# Patient Record
Sex: Female | Born: 1955 | Race: White | Hispanic: No | Marital: Married | Smoking: Never smoker
Health system: Southern US, Community
[De-identification: ages and names within clinical notes are randomized; demographics above are authoritative.]

## PROBLEM LIST (undated history)

## (undated) DIAGNOSIS — I10 Essential (primary) hypertension: Secondary | ICD-10-CM

## (undated) DIAGNOSIS — G35D Multiple sclerosis, unspecified: Secondary | ICD-10-CM

## (undated) DIAGNOSIS — M21612 Bunion of left foot: Secondary | ICD-10-CM

## (undated) DIAGNOSIS — N3281 Overactive bladder: Secondary | ICD-10-CM

## (undated) DIAGNOSIS — M1990 Unspecified osteoarthritis, unspecified site: Secondary | ICD-10-CM

## (undated) DIAGNOSIS — M21611 Bunion of right foot: Secondary | ICD-10-CM

## (undated) DIAGNOSIS — G35 Multiple sclerosis: Secondary | ICD-10-CM

## (undated) HISTORY — PX: KNEE ARTHROSCOPY: SUR90

## (undated) HISTORY — DX: Overactive bladder: N32.81

---

## 1987-05-26 HISTORY — PX: CHOLECYSTECTOMY: SHX55

## 1998-01-09 ENCOUNTER — Encounter: Admission: RE | Admit: 1998-01-09 | Discharge: 1998-04-09 | Payer: Self-pay | Admitting: Orthopedic Surgery

## 1998-02-10 ENCOUNTER — Emergency Department (HOSPITAL_COMMUNITY): Admission: EM | Admit: 1998-02-10 | Discharge: 1998-02-10 | Payer: Self-pay | Admitting: Emergency Medicine

## 1998-02-12 ENCOUNTER — Ambulatory Visit (HOSPITAL_COMMUNITY): Admission: RE | Admit: 1998-02-12 | Discharge: 1998-02-12 | Payer: Self-pay | Admitting: Pediatrics

## 1998-06-05 ENCOUNTER — Ambulatory Visit (HOSPITAL_COMMUNITY): Admission: RE | Admit: 1998-06-05 | Discharge: 1998-06-05 | Payer: Self-pay | Admitting: Gastroenterology

## 1998-12-05 ENCOUNTER — Ambulatory Visit (HOSPITAL_COMMUNITY): Admission: RE | Admit: 1998-12-05 | Discharge: 1998-12-05 | Payer: Self-pay | Admitting: Obstetrics and Gynecology

## 2000-09-20 ENCOUNTER — Encounter: Admission: RE | Admit: 2000-09-20 | Discharge: 2000-09-20 | Payer: Self-pay | Admitting: Obstetrics and Gynecology

## 2000-09-20 ENCOUNTER — Encounter: Payer: Self-pay | Admitting: Obstetrics and Gynecology

## 2002-05-02 ENCOUNTER — Ambulatory Visit (HOSPITAL_COMMUNITY): Admission: RE | Admit: 2002-05-02 | Discharge: 2002-05-02 | Payer: Self-pay | Admitting: Orthopedic Surgery

## 2002-05-07 ENCOUNTER — Encounter: Payer: Self-pay | Admitting: Orthopedic Surgery

## 2002-05-07 ENCOUNTER — Ambulatory Visit (HOSPITAL_COMMUNITY): Admission: RE | Admit: 2002-05-07 | Discharge: 2002-05-07 | Payer: Self-pay | Admitting: Orthopedic Surgery

## 2002-07-26 ENCOUNTER — Encounter (HOSPITAL_COMMUNITY): Admission: RE | Admit: 2002-07-26 | Discharge: 2002-10-24 | Payer: Self-pay | Admitting: Neurology

## 2003-11-06 ENCOUNTER — Encounter (HOSPITAL_COMMUNITY): Admission: RE | Admit: 2003-11-06 | Discharge: 2003-11-19 | Payer: Self-pay | Admitting: Neurology

## 2004-12-13 ENCOUNTER — Ambulatory Visit (HOSPITAL_COMMUNITY): Admission: RE | Admit: 2004-12-13 | Discharge: 2004-12-13 | Payer: Self-pay | Admitting: Orthopedic Surgery

## 2005-01-14 ENCOUNTER — Ambulatory Visit (HOSPITAL_COMMUNITY): Admission: RE | Admit: 2005-01-14 | Discharge: 2005-01-14 | Payer: Self-pay | Admitting: Orthopedic Surgery

## 2005-01-14 ENCOUNTER — Ambulatory Visit (HOSPITAL_BASED_OUTPATIENT_CLINIC_OR_DEPARTMENT_OTHER): Admission: RE | Admit: 2005-01-14 | Discharge: 2005-01-14 | Payer: Self-pay | Admitting: Orthopedic Surgery

## 2005-01-28 ENCOUNTER — Encounter: Admission: RE | Admit: 2005-01-28 | Discharge: 2005-02-18 | Payer: Self-pay | Admitting: Orthopedic Surgery

## 2005-05-12 ENCOUNTER — Encounter: Admission: RE | Admit: 2005-05-12 | Discharge: 2005-05-12 | Payer: Self-pay | Admitting: Neurology

## 2005-08-21 ENCOUNTER — Ambulatory Visit (HOSPITAL_COMMUNITY): Admission: RE | Admit: 2005-08-21 | Discharge: 2005-08-21 | Payer: Self-pay | Admitting: Neurology

## 2005-09-23 ENCOUNTER — Encounter: Payer: Self-pay | Admitting: Pediatrics

## 2006-09-09 ENCOUNTER — Ambulatory Visit (HOSPITAL_COMMUNITY): Admission: RE | Admit: 2006-09-09 | Discharge: 2006-09-09 | Payer: Self-pay | Admitting: Neurology

## 2006-10-08 ENCOUNTER — Encounter (HOSPITAL_COMMUNITY): Admission: RE | Admit: 2006-10-08 | Discharge: 2007-01-06 | Payer: Self-pay | Admitting: Neurology

## 2006-11-04 ENCOUNTER — Encounter: Admission: RE | Admit: 2006-11-04 | Discharge: 2006-11-04 | Payer: Self-pay | Admitting: Obstetrics and Gynecology

## 2007-01-25 ENCOUNTER — Encounter (HOSPITAL_COMMUNITY): Admission: RE | Admit: 2007-01-25 | Discharge: 2007-04-25 | Payer: Self-pay | Admitting: Neurology

## 2007-05-04 ENCOUNTER — Encounter (HOSPITAL_COMMUNITY): Admission: RE | Admit: 2007-05-04 | Discharge: 2007-06-06 | Payer: Self-pay | Admitting: Neurology

## 2007-06-08 ENCOUNTER — Encounter (HOSPITAL_COMMUNITY): Admission: RE | Admit: 2007-06-08 | Discharge: 2007-09-06 | Payer: Self-pay | Admitting: Neurology

## 2007-10-05 ENCOUNTER — Encounter (HOSPITAL_COMMUNITY): Admission: RE | Admit: 2007-10-05 | Discharge: 2008-01-02 | Payer: Self-pay | Admitting: Neurology

## 2007-12-02 ENCOUNTER — Encounter (HOSPITAL_COMMUNITY): Admission: RE | Admit: 2007-12-02 | Discharge: 2008-02-01 | Payer: Self-pay | Admitting: Neurology

## 2008-02-28 ENCOUNTER — Encounter (HOSPITAL_COMMUNITY): Admission: RE | Admit: 2008-02-28 | Discharge: 2008-05-28 | Payer: Self-pay | Admitting: Neurology

## 2008-05-29 ENCOUNTER — Encounter (HOSPITAL_COMMUNITY): Admission: RE | Admit: 2008-05-29 | Discharge: 2008-08-27 | Payer: Self-pay | Admitting: Neurology

## 2008-08-15 ENCOUNTER — Encounter: Admission: RE | Admit: 2008-08-15 | Discharge: 2008-11-13 | Payer: Self-pay | Admitting: Neurology

## 2008-09-17 ENCOUNTER — Encounter (HOSPITAL_COMMUNITY): Admission: RE | Admit: 2008-09-17 | Discharge: 2008-12-16 | Payer: Self-pay | Admitting: Neurology

## 2008-10-31 ENCOUNTER — Encounter: Admission: RE | Admit: 2008-10-31 | Discharge: 2008-10-31 | Payer: Self-pay | Admitting: Neurology

## 2008-12-24 ENCOUNTER — Encounter (HOSPITAL_COMMUNITY): Admission: RE | Admit: 2008-12-24 | Discharge: 2009-03-24 | Payer: Self-pay | Admitting: Neurology

## 2009-04-15 ENCOUNTER — Encounter (HOSPITAL_COMMUNITY): Admission: RE | Admit: 2009-04-15 | Discharge: 2009-05-24 | Payer: Self-pay | Admitting: Neurology

## 2009-05-13 ENCOUNTER — Encounter: Admission: RE | Admit: 2009-05-13 | Discharge: 2009-05-13 | Payer: Self-pay | Admitting: Neurology

## 2009-06-10 ENCOUNTER — Encounter (HOSPITAL_COMMUNITY): Admission: RE | Admit: 2009-06-10 | Discharge: 2009-09-08 | Payer: Self-pay | Admitting: Neurology

## 2009-09-30 ENCOUNTER — Encounter (HOSPITAL_COMMUNITY): Admission: RE | Admit: 2009-09-30 | Discharge: 2009-12-29 | Payer: Self-pay | Admitting: Neurology

## 2009-11-26 ENCOUNTER — Encounter: Admission: RE | Admit: 2009-11-26 | Discharge: 2009-11-26 | Payer: Self-pay | Admitting: Neurology

## 2010-01-01 ENCOUNTER — Encounter (HOSPITAL_COMMUNITY): Admission: RE | Admit: 2010-01-01 | Discharge: 2010-02-25 | Payer: Self-pay | Admitting: Neurology

## 2010-02-25 ENCOUNTER — Encounter (HOSPITAL_COMMUNITY)
Admission: RE | Admit: 2010-02-25 | Discharge: 2010-05-26 | Payer: Self-pay | Source: Home / Self Care | Attending: Diagnostic Neuroimaging | Admitting: Diagnostic Neuroimaging

## 2010-05-21 ENCOUNTER — Encounter
Admission: RE | Admit: 2010-05-21 | Discharge: 2010-05-21 | Payer: Self-pay | Source: Home / Self Care | Attending: Diagnostic Neuroimaging | Admitting: Diagnostic Neuroimaging

## 2010-05-28 ENCOUNTER — Encounter (HOSPITAL_COMMUNITY)
Admission: RE | Admit: 2010-05-28 | Discharge: 2010-06-24 | Payer: Self-pay | Source: Home / Self Care | Attending: Diagnostic Neuroimaging | Admitting: Diagnostic Neuroimaging

## 2010-06-26 ENCOUNTER — Ambulatory Visit (HOSPITAL_COMMUNITY): Payer: Managed Care, Other (non HMO) | Attending: Diagnostic Neuroimaging

## 2010-06-26 DIAGNOSIS — G35 Multiple sclerosis: Secondary | ICD-10-CM | POA: Insufficient documentation

## 2010-06-27 ENCOUNTER — Encounter (HOSPITAL_COMMUNITY): Payer: Self-pay

## 2010-07-25 ENCOUNTER — Encounter (HOSPITAL_COMMUNITY): Payer: Managed Care, Other (non HMO) | Attending: Diagnostic Neuroimaging

## 2010-07-25 DIAGNOSIS — G35 Multiple sclerosis: Secondary | ICD-10-CM | POA: Insufficient documentation

## 2010-08-22 ENCOUNTER — Encounter (HOSPITAL_COMMUNITY)
Admission: RE | Admit: 2010-08-22 | Discharge: 2010-08-22 | Disposition: A | Discharge status: Home / Self Care | Payer: Managed Care, Other (non HMO) | Source: Ambulatory Visit | Attending: Diagnostic Neuroimaging | Admitting: Diagnostic Neuroimaging

## 2010-08-22 DIAGNOSIS — G35 Multiple sclerosis: Secondary | ICD-10-CM | POA: Insufficient documentation

## 2010-09-19 ENCOUNTER — Encounter (HOSPITAL_COMMUNITY)
Admission: RE | Admit: 2010-09-19 | Discharge: 2010-09-19 | Disposition: A | Discharge status: Home / Self Care | Payer: Managed Care, Other (non HMO) | Source: Ambulatory Visit | Attending: Diagnostic Neuroimaging | Admitting: Diagnostic Neuroimaging

## 2010-09-19 DIAGNOSIS — G35 Multiple sclerosis: Secondary | ICD-10-CM | POA: Insufficient documentation

## 2010-10-10 NOTE — Op Note (Signed)
NAME:  Tiffany Barnes, Tiffany Barnes NO.:  [id number]   MEDICAL RECORD NO.:  [id number]          PATIENT TYPE:  AMB   LOCATION:  DSC                          FACILITY:  [facility]   PHYSICIAN:  Dyke Brackett, M.D.    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   POSTOPERATIVE DIAGNOSIS:  Torn lateral meniscus with osteoarthritis  patellofemoral joint and medial compartment.   OPERATION:  1.  Partial lateral meniscectomy.  2.  Debridement chondroplasty of patellofemoral joint, lateral compartment.   SURGEON:  Dr. Madelon Lips   ANESTHESIA:  General with a block.   DESCRIPTION OF PROCEDURE:  She was arthroscoped through an inferomedial and  inferolateral portal.  Systematic inspection of the knee showed the patient  to have grade 3 changes, generalized over the entire weightbearing surface  of the lateral compartment.  These were accompanied by a degenerative tear  of the meniscus requiring resection of the probably the anterior two-thirds  of the meniscus, leaving the meniscus substance posteriorly around.  Aggressive debridement was carried out.  Good resection line was obtained.  The medial compartment was essentially normal.  Patellofemoral joint showed  moderate changes of an osteoarthritic nature, and these were aggressively  debrided.  Knee drained free of fluid, portals closed with nylon, and  infiltrated with Marcaine and morphine and given 80 mg Depo-Medrol.      Dyke Brackett, M.D.  Electronically Signed     WDC/MEDQ  D:  01/14/2005  T:  01/14/2005  Job:  [id number]

## 2010-10-22 ENCOUNTER — Encounter (HOSPITAL_COMMUNITY)
Admission: RE | Admit: 2010-10-22 | Discharge: 2010-10-22 | Disposition: A | Discharge status: Home / Self Care | Payer: Managed Care, Other (non HMO) | Source: Ambulatory Visit | Attending: Diagnostic Neuroimaging | Admitting: Diagnostic Neuroimaging

## 2010-10-22 DIAGNOSIS — G35 Multiple sclerosis: Secondary | ICD-10-CM | POA: Insufficient documentation

## 2010-11-03 ENCOUNTER — Other Ambulatory Visit: Payer: Self-pay | Admitting: Diagnostic Neuroimaging

## 2010-11-03 DIAGNOSIS — G35 Multiple sclerosis: Secondary | ICD-10-CM

## 2010-11-12 ENCOUNTER — Ambulatory Visit
Admission: RE | Admit: 2010-11-12 | Discharge: 2010-11-12 | Disposition: A | Discharge status: Home / Self Care | Payer: Managed Care, Other (non HMO) | Source: Ambulatory Visit | Attending: Diagnostic Neuroimaging | Admitting: Diagnostic Neuroimaging

## 2010-11-12 DIAGNOSIS — G35 Multiple sclerosis: Secondary | ICD-10-CM

## 2010-11-19 ENCOUNTER — Ambulatory Visit (HOSPITAL_COMMUNITY): Payer: Managed Care, Other (non HMO) | Attending: Diagnostic Neuroimaging

## 2010-11-19 DIAGNOSIS — G35 Multiple sclerosis: Secondary | ICD-10-CM | POA: Insufficient documentation

## 2010-12-17 ENCOUNTER — Encounter (HOSPITAL_COMMUNITY): Payer: Managed Care, Other (non HMO)

## 2010-12-22 ENCOUNTER — Encounter (HOSPITAL_COMMUNITY): Payer: Managed Care, Other (non HMO)

## 2010-12-25 ENCOUNTER — Encounter (HOSPITAL_COMMUNITY): Payer: Managed Care, Other (non HMO) | Attending: Diagnostic Neuroimaging

## 2010-12-25 DIAGNOSIS — G35 Multiple sclerosis: Secondary | ICD-10-CM | POA: Insufficient documentation

## 2011-01-22 ENCOUNTER — Encounter (HOSPITAL_COMMUNITY): Payer: Managed Care, Other (non HMO)

## 2011-04-03 ENCOUNTER — Other Ambulatory Visit: Payer: Self-pay | Admitting: Diagnostic Neuroimaging

## 2011-04-03 DIAGNOSIS — G35 Multiple sclerosis: Secondary | ICD-10-CM

## 2011-04-10 ENCOUNTER — Ambulatory Visit
Admission: RE | Admit: 2011-04-10 | Discharge: 2011-04-10 | Disposition: A | Discharge status: Home / Self Care | Payer: Managed Care, Other (non HMO) | Source: Ambulatory Visit | Attending: Diagnostic Neuroimaging | Admitting: Diagnostic Neuroimaging

## 2011-04-10 DIAGNOSIS — G35 Multiple sclerosis: Secondary | ICD-10-CM

## 2011-04-10 MED ORDER — GADOBENATE DIMEGLUMINE 529 MG/ML IV SOLN
19.0000 mL | Freq: Once | INTRAVENOUS | Status: AC | PRN
  Administered 2011-04-10: 19 mL via INTRAVENOUS

## 2011-08-12 ENCOUNTER — Other Ambulatory Visit: Payer: Self-pay | Admitting: Family Medicine

## 2011-08-12 DIAGNOSIS — Z1231 Encounter for screening mammogram for malignant neoplasm of breast: Secondary | ICD-10-CM

## 2011-08-24 ENCOUNTER — Ambulatory Visit
Admission: RE | Admit: 2011-08-24 | Discharge: 2011-08-24 | Disposition: A | Discharge status: Home / Self Care | Payer: Managed Care, Other (non HMO) | Source: Ambulatory Visit | Attending: Family Medicine | Admitting: Family Medicine

## 2011-08-24 DIAGNOSIS — Z1231 Encounter for screening mammogram for malignant neoplasm of breast: Secondary | ICD-10-CM

## 2012-08-01 ENCOUNTER — Encounter (HOSPITAL_BASED_OUTPATIENT_CLINIC_OR_DEPARTMENT_OTHER): Payer: Managed Care, Other (non HMO) | Attending: Plastic Surgery

## 2012-08-01 DIAGNOSIS — S20229A Contusion of unspecified back wall of thorax, initial encounter: Secondary | ICD-10-CM | POA: Insufficient documentation

## 2012-08-01 DIAGNOSIS — Y92410 Unspecified street and highway as the place of occurrence of the external cause: Secondary | ICD-10-CM | POA: Insufficient documentation

## 2012-08-01 DIAGNOSIS — Z79899 Other long term (current) drug therapy: Secondary | ICD-10-CM | POA: Insufficient documentation

## 2012-08-01 DIAGNOSIS — S301XXA Contusion of abdominal wall, initial encounter: Secondary | ICD-10-CM | POA: Insufficient documentation

## 2012-08-01 DIAGNOSIS — I10 Essential (primary) hypertension: Secondary | ICD-10-CM | POA: Insufficient documentation

## 2012-08-01 DIAGNOSIS — S9030XA Contusion of unspecified foot, initial encounter: Secondary | ICD-10-CM | POA: Insufficient documentation

## 2012-08-01 DIAGNOSIS — G35 Multiple sclerosis: Secondary | ICD-10-CM | POA: Insufficient documentation

## 2012-08-01 DIAGNOSIS — L988 Other specified disorders of the skin and subcutaneous tissue: Secondary | ICD-10-CM | POA: Insufficient documentation

## 2012-08-01 DIAGNOSIS — S8010XA Contusion of unspecified lower leg, initial encounter: Secondary | ICD-10-CM | POA: Insufficient documentation

## 2012-08-01 DIAGNOSIS — S20219A Contusion of unspecified front wall of thorax, initial encounter: Secondary | ICD-10-CM | POA: Insufficient documentation
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
## 2012-08-10 ENCOUNTER — Other Ambulatory Visit: Payer: Self-pay | Admitting: Diagnostic Neuroimaging

## 2012-08-11 LAB — CBC WITH DIFFERENTIAL/PLATELET
Basos: 0 % (ref 0–3)
Eosinophils Absolute: 0.1 10*3/uL (ref 0.0–0.4)
Immature Grans (Abs): 0 10*3/uL (ref 0.0–0.1)
Immature Granulocytes: 0 % (ref 0–2)
MCH: 28.1 pg (ref 26.6–33.0)
MCHC: 32.2 g/dL (ref 31.5–35.7)
MCV: 87 fL (ref 79–97)
Monocytes: 9 % (ref 4–12)
Neutrophils Absolute: 5.8 10*3/uL (ref 1.4–7.0)
Neutrophils Relative %: 82 % — ABNORMAL HIGH (ref 40–74)
RDW: 13.7 % (ref 12.3–15.4)
WBC: 7 10*3/uL (ref 3.4–10.8)

## 2012-08-18 ENCOUNTER — Telehealth: Payer: Self-pay | Admitting: *Deleted

## 2012-08-18 NOTE — Telephone Encounter (Signed)
Patient calling wanting to know her lab results.   Did not see results in [facility].

## 2012-08-18 NOTE — Telephone Encounter (Signed)
Labs were better. They were put in the new system under Dr. Richrd Humbles name. Thanks for calling.

## 2012-08-19 NOTE — Telephone Encounter (Signed)
Spoke to the patient and she is aware of her labs results.

## 2012-08-24 ENCOUNTER — Encounter (HOSPITAL_BASED_OUTPATIENT_CLINIC_OR_DEPARTMENT_OTHER): Payer: Managed Care, Other (non HMO) | Attending: General Surgery

## 2012-08-24 DIAGNOSIS — T8189XA Other complications of procedures, not elsewhere classified, initial encounter: Secondary | ICD-10-CM | POA: Insufficient documentation

## 2012-08-24 DIAGNOSIS — Y839 Surgical procedure, unspecified as the cause of abnormal reaction of the patient, or of later complication, without mention of misadventure at the time of the procedure: Secondary | ICD-10-CM | POA: Insufficient documentation

## 2012-09-23 ENCOUNTER — Encounter (HOSPITAL_BASED_OUTPATIENT_CLINIC_OR_DEPARTMENT_OTHER): Payer: Managed Care, Other (non HMO) | Attending: General Surgery

## 2012-09-23 DIAGNOSIS — Y839 Surgical procedure, unspecified as the cause of abnormal reaction of the patient, or of later complication, without mention of misadventure at the time of the procedure: Secondary | ICD-10-CM | POA: Insufficient documentation

## 2012-09-23 DIAGNOSIS — T8189XA Other complications of procedures, not elsewhere classified, initial encounter: Secondary | ICD-10-CM | POA: Insufficient documentation

## 2012-09-23 DIAGNOSIS — I87309 Chronic venous hypertension (idiopathic) without complications of unspecified lower extremity: Secondary | ICD-10-CM | POA: Insufficient documentation

## 2012-10-24 ENCOUNTER — Ambulatory Visit: Payer: Self-pay | Admitting: Diagnostic Neuroimaging

## 2012-10-26 ENCOUNTER — Encounter (HOSPITAL_BASED_OUTPATIENT_CLINIC_OR_DEPARTMENT_OTHER): Payer: Managed Care, Other (non HMO) | Attending: General Surgery

## 2012-10-26 DIAGNOSIS — G35 Multiple sclerosis: Secondary | ICD-10-CM | POA: Insufficient documentation

## 2012-10-26 DIAGNOSIS — L97809 Non-pressure chronic ulcer of other part of unspecified lower leg with unspecified severity: Secondary | ICD-10-CM | POA: Insufficient documentation

## 2012-10-26 DIAGNOSIS — I10 Essential (primary) hypertension: Secondary | ICD-10-CM | POA: Insufficient documentation

## 2012-10-27 ENCOUNTER — Encounter (HOSPITAL_BASED_OUTPATIENT_CLINIC_OR_DEPARTMENT_OTHER): Payer: Self-pay | Admitting: *Deleted

## 2012-10-27 NOTE — Progress Notes (Addendum)
To [facility] at 1415-Istat,Ekg on arrival-Npo solid food after Mn-clear liquids only til 0630,then Npo.instructed to bring wound vac and any supplies from home.  DR SANGER HAD CANCELLATION SO THIS PT IS MOVED TO 1345. CALLED AND SPOKE TO PT TO ARRIVE AT 1230 AND CLEAR LIQUIDS UNTIL 0630
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
## 2012-10-31 ENCOUNTER — Encounter (HOSPITAL_BASED_OUTPATIENT_CLINIC_OR_DEPARTMENT_OTHER): Payer: Self-pay

## 2012-10-31 ENCOUNTER — Other Ambulatory Visit: Payer: Self-pay

## 2012-10-31 ENCOUNTER — Ambulatory Visit (HOSPITAL_BASED_OUTPATIENT_CLINIC_OR_DEPARTMENT_OTHER): Payer: Managed Care, Other (non HMO) | Admitting: Anesthesiology

## 2012-10-31 ENCOUNTER — Other Ambulatory Visit: Payer: Self-pay | Admitting: Plastic Surgery

## 2012-10-31 ENCOUNTER — Encounter (HOSPITAL_BASED_OUTPATIENT_CLINIC_OR_DEPARTMENT_OTHER): Payer: Self-pay | Admitting: Anesthesiology

## 2012-10-31 ENCOUNTER — Ambulatory Visit (HOSPITAL_BASED_OUTPATIENT_CLINIC_OR_DEPARTMENT_OTHER)
Admission: RE | Admit: 2012-10-31 | Discharge: 2012-10-31 | Disposition: A | Discharge status: Home / Self Care | Payer: Managed Care, Other (non HMO) | Source: Ambulatory Visit | Attending: Plastic Surgery | Admitting: Plastic Surgery

## 2012-10-31 ENCOUNTER — Encounter (HOSPITAL_BASED_OUTPATIENT_CLINIC_OR_DEPARTMENT_OTHER)
Admission: RE | Disposition: A | Discharge status: Home / Self Care | Payer: Self-pay | Source: Ambulatory Visit | Attending: Plastic Surgery

## 2012-10-31 DIAGNOSIS — S81809A Unspecified open wound, unspecified lower leg, initial encounter: Secondary | ICD-10-CM

## 2012-10-31 DIAGNOSIS — I10 Essential (primary) hypertension: Secondary | ICD-10-CM | POA: Insufficient documentation

## 2012-10-31 DIAGNOSIS — M1710 Unilateral primary osteoarthritis, unspecified knee: Secondary | ICD-10-CM | POA: Insufficient documentation

## 2012-10-31 DIAGNOSIS — L97909 Non-pressure chronic ulcer of unspecified part of unspecified lower leg with unspecified severity: Secondary | ICD-10-CM | POA: Insufficient documentation

## 2012-10-31 DIAGNOSIS — L97902 Non-pressure chronic ulcer of unspecified part of unspecified lower leg with fat layer exposed: Secondary | ICD-10-CM

## 2012-10-31 DIAGNOSIS — G35 Multiple sclerosis: Secondary | ICD-10-CM | POA: Insufficient documentation

## 2012-10-31 DIAGNOSIS — M21619 Bunion of unspecified foot: Secondary | ICD-10-CM | POA: Insufficient documentation

## 2012-10-31 DIAGNOSIS — S81009A Unspecified open wound, unspecified knee, initial encounter: Secondary | ICD-10-CM

## 2012-10-31 HISTORY — DX: Multiple sclerosis: G35

## 2012-10-31 HISTORY — DX: Unspecified osteoarthritis, unspecified site: M19.90

## 2012-10-31 HISTORY — DX: Multiple sclerosis, unspecified: G35.D

## 2012-10-31 HISTORY — DX: Bunion of right foot: M21.611

## 2012-10-31 HISTORY — DX: Essential (primary) hypertension: I10

## 2012-10-31 HISTORY — PX: INCISION AND DRAINAGE OF WOUND: SHX1803

## 2012-10-31 HISTORY — DX: Bunion of right foot: M21.612

## 2012-10-31 LAB — POCT I-STAT 4, (NA,K, GLUC, HGB,HCT)
Potassium: 3.7 mEq/L (ref 3.5–5.1)
Sodium: 140 mEq/L (ref 135–145)

## 2012-10-31 SURGERY — IRRIGATION AND DEBRIDEMENT WOUND
Anesthesia: General | Site: Leg Lower | Laterality: Bilateral | Wound class: Clean

## 2012-10-31 MED ORDER — LACTATED RINGERS IV SOLN
INTRAVENOUS | Status: DC
  Administered 2012-10-31 (×2): via INTRAVENOUS
  Filled 2012-10-31: qty 1000

## 2012-10-31 MED ORDER — ONDANSETRON HCL 4 MG/2ML IJ SOLN
INTRAMUSCULAR | Status: DC | PRN
  Administered 2012-10-31: 4 mg via INTRAVENOUS

## 2012-10-31 MED ORDER — OXYCODONE HCL 5 MG PO TABS
5.0000 mg | ORAL_TABLET | ORAL | Status: DC | PRN
  Administered 2012-10-31: 5 mg via ORAL
  Filled 2012-10-31: qty 1

## 2012-10-31 MED ORDER — LIDOCAINE HCL (CARDIAC) 20 MG/ML IV SOLN
INTRAVENOUS | Status: DC | PRN
  Administered 2012-10-31: 50 mg via INTRAVENOUS

## 2012-10-31 MED ORDER — PROPOFOL 10 MG/ML IV BOLUS
INTRAVENOUS | Status: DC | PRN
  Administered 2012-10-31: 150 mg via INTRAVENOUS

## 2012-10-31 MED ORDER — EPHEDRINE SULFATE 50 MG/ML IJ SOLN
INTRAMUSCULAR | Status: DC | PRN
  Administered 2012-10-31: 10 mg via INTRAVENOUS

## 2012-10-31 MED ORDER — CEFAZOLIN SODIUM-DEXTROSE 2-3 GM-% IV SOLR
2.0000 g | INTRAVENOUS | Status: AC
  Administered 2012-10-31: 2 g via INTRAVENOUS
  Filled 2012-10-31: qty 50

## 2012-10-31 MED ORDER — GLYCOPYRROLATE 0.2 MG/ML IJ SOLN
INTRAMUSCULAR | Status: DC | PRN
  Administered 2012-10-31: 0.2 mg via INTRAVENOUS

## 2012-10-31 MED ORDER — SODIUM CHLORIDE 0.9 % IR SOLN
Status: DC | PRN
  Administered 2012-10-31: 15:00:00

## 2012-10-31 MED ORDER — ACETAMINOPHEN 10 MG/ML IV SOLN
INTRAVENOUS | Status: DC | PRN
  Administered 2012-10-31: 1000 mg via INTRAVENOUS

## 2012-10-31 MED ORDER — LACTATED RINGERS IV SOLN
INTRAVENOUS | Status: DC
  Filled 2012-10-31: qty 1000

## 2012-10-31 MED ORDER — DEXAMETHASONE SODIUM PHOSPHATE 4 MG/ML IJ SOLN
INTRAMUSCULAR | Status: DC | PRN
  Administered 2012-10-31: 4 mg via INTRAVENOUS

## 2012-10-31 MED ORDER — FENTANYL CITRATE 0.05 MG/ML IJ SOLN
INTRAMUSCULAR | Status: DC | PRN
  Administered 2012-10-31: 25 ug via INTRAVENOUS
  Administered 2012-10-31: 50 ug via INTRAVENOUS
  Administered 2012-10-31: 25 ug via INTRAVENOUS

## 2012-10-31 MED ORDER — FENTANYL CITRATE 0.05 MG/ML IJ SOLN
25.0000 ug | INTRAMUSCULAR | Status: DC | PRN
  Administered 2012-10-31 (×2): 25 ug via INTRAVENOUS
  Administered 2012-10-31: 50 ug via INTRAVENOUS
  Filled 2012-10-31: qty 1

## 2012-10-31 SURGICAL SUPPLY — 82 items
BAG DECANTER FOR FLEXI CONT (MISCELLANEOUS) IMPLANT
BANDAGE ELASTIC 3 VELCRO ST LF (GAUZE/BANDAGES/DRESSINGS) IMPLANT
BANDAGE ELASTIC 4 VELCRO ST LF (GAUZE/BANDAGES/DRESSINGS) ×4 IMPLANT
BANDAGE ELASTIC 6 VELCRO ST LF (GAUZE/BANDAGES/DRESSINGS) IMPLANT
BANDAGE GAUZE ELAST BULKY 4 IN (GAUZE/BANDAGES/DRESSINGS) ×4 IMPLANT
BENZOIN TINCTURE PRP APPL 2/3 (GAUZE/BANDAGES/DRESSINGS) IMPLANT
BLADE MINI RND TIP GREEN BEAV (BLADE) IMPLANT
BLADE SURG 10 STRL SS (BLADE) IMPLANT
BLADE SURG 15 STRL LF DISP TIS (BLADE) ×1 IMPLANT
BLADE SURG 15 STRL SS (BLADE) ×1
BNDG COHESIVE 1X5 TAN STRL LF (GAUZE/BANDAGES/DRESSINGS) IMPLANT
BNDG COHESIVE 4X5 TAN NS LF (GAUZE/BANDAGES/DRESSINGS) ×2 IMPLANT
BNDG ESMARK 4X9 LF (GAUZE/BANDAGES/DRESSINGS) IMPLANT
CANISTER SUCTION 2500CC (MISCELLANEOUS) IMPLANT
CHLORAPREP W/TINT 26ML (MISCELLANEOUS) IMPLANT
CLOTH BEACON ORANGE TIMEOUT ST (SAFETY) ×2 IMPLANT
COVER MAYO STAND STRL (DRAPES) ×2 IMPLANT
COVER TABLE BACK 60X90 (DRAPES) ×2 IMPLANT
DECANTER SPIKE VIAL GLASS SM (MISCELLANEOUS) IMPLANT
DRAIN PENROSE 18X1/2 LTX STRL (DRAIN) ×2 IMPLANT
DRAPE EXTREMITY T 121X128X90 (DRAPE) IMPLANT
DRAPE INCISE IOBAN 66X45 STRL (DRAPES) ×2 IMPLANT
DRAPE LG THREE QUARTER DISP (DRAPES) IMPLANT
DRSG ADAPTIC 3X8 NADH LF (GAUZE/BANDAGES/DRESSINGS) ×8 IMPLANT
DRSG EMULSION OIL 3X3 NADH (GAUZE/BANDAGES/DRESSINGS) IMPLANT
DRSG PAD ABDOMINAL 8X10 ST (GAUZE/BANDAGES/DRESSINGS) IMPLANT
ELECT NEEDLE TIP 2.8 STRL (NEEDLE) IMPLANT
ELECT REM PT RETURN 9FT ADLT (ELECTROSURGICAL) ×2
ELECTRODE REM PT RTRN 9FT ADLT (ELECTROSURGICAL) ×1 IMPLANT
GAUZE SPONGE 4X4 12PLY STRL LF (GAUZE/BANDAGES/DRESSINGS) IMPLANT
GAUZE XEROFORM 1X8 LF (GAUZE/BANDAGES/DRESSINGS) ×4 IMPLANT
GAUZE XEROFORM 5X9 LF (GAUZE/BANDAGES/DRESSINGS) IMPLANT
GLOVE BIO SURGEON STRL SZ 6.5 (GLOVE) ×8 IMPLANT
GOWN BRE IMP SLV AUR LG STRL (GOWN DISPOSABLE) ×6 IMPLANT
GOWN PREVENTION PLUS XLARGE (GOWN DISPOSABLE) ×2 IMPLANT
IV NS IRRIG 3000ML ARTHROMATIC (IV SOLUTION) IMPLANT
MATRIX SURGICAL PSMX 10X15CM (Tissue) ×2 IMPLANT
MICROMATRIX 500MG (Tissue) ×4 IMPLANT
NEEDLE 27GAX1X1/2 (NEEDLE) IMPLANT
NEEDLE HYPO 30GX1 BEV (NEEDLE) IMPLANT
NS IRRIG 1000ML POUR BTL (IV SOLUTION) ×2 IMPLANT
PACK BASIN DAY SURGERY FS (CUSTOM PROCEDURE TRAY) ×2 IMPLANT
PADDING CAST ABS 3INX4YD NS (CAST SUPPLIES)
PADDING CAST ABS 4INX4YD NS (CAST SUPPLIES)
PADDING CAST ABS COTTON 3X4 (CAST SUPPLIES) IMPLANT
PADDING CAST ABS COTTON 4X4 ST (CAST SUPPLIES) IMPLANT
PENCIL BUTTON HOLSTER BLD 10FT (ELECTRODE) ×2 IMPLANT
SLEEVE SCD COMPRESS KNEE MED (MISCELLANEOUS) IMPLANT
SOLUTION PARTIC MCRMTRX 500MG (Tissue) ×2 IMPLANT
SPLINT PLASTER CAST XFAST 3X15 (CAST SUPPLIES) IMPLANT
SPLINT PLASTER XTRA FASTSET 3X (CAST SUPPLIES)
SPONGE GAUZE 4X4 12PLY (GAUZE/BANDAGES/DRESSINGS) ×2 IMPLANT
SPONGE LAP 18X18 X RAY DECT (DISPOSABLE) IMPLANT
SPONGE LAP 4X18 X RAY DECT (DISPOSABLE) IMPLANT
STAPLER VISISTAT 35W (STAPLE) IMPLANT
STOCKINETTE 4X48 STRL (DRAPES) IMPLANT
STOCKINETTE 6  STRL (DRAPES) ×1
STOCKINETTE 6 STRL (DRAPES) ×1 IMPLANT
STOCKINETTE IMPERVIOUS LG (DRAPES) IMPLANT
STRIP CLOSURE SKIN 1/2X4 (GAUZE/BANDAGES/DRESSINGS) IMPLANT
SUCTION FRAZIER TIP 10 FR DISP (SUCTIONS) IMPLANT
SURGILUBE 2OZ TUBE FLIPTOP (MISCELLANEOUS) IMPLANT
SUT ETHILON 3 0 PS 1 (SUTURE) IMPLANT
SUT ETHILON 4 0 P 3 18 (SUTURE) IMPLANT
SUT ETHILON 5 0 PS 2 18 (SUTURE) IMPLANT
SUT PROLENE 3 0 PS 2 (SUTURE) IMPLANT
SUT SILK 3 0 PS 1 (SUTURE) IMPLANT
SUT VIC AB 3-0 FS2 27 (SUTURE) IMPLANT
SUT VIC AB 5-0 P-3 18X BRD (SUTURE) ×6 IMPLANT
SUT VIC AB 5-0 P3 18 (SUTURE) ×6
SUT VIC AB 5-0 PS2 18 (SUTURE) IMPLANT
SYR BULB IRRIGATION 50ML (SYRINGE) IMPLANT
SYR CONTROL 10ML LL (SYRINGE) ×2 IMPLANT
TAPE HYPAFIX 6X30 (GAUZE/BANDAGES/DRESSINGS) IMPLANT
TIP FLEX 45CM EVICEL (HEMOSTASIS) IMPLANT
TIP RIGID 35CM EVICEL (HEMOSTASIS) IMPLANT
TOWEL OR 17X24 6PK STRL BLUE (TOWEL DISPOSABLE) ×2 IMPLANT
TRAY DSU PREP LF (CUSTOM PROCEDURE TRAY) IMPLANT
TUBE CONNECTING 12X1/4 (SUCTIONS) IMPLANT
UNDERPAD 30X30 INCONTINENT (UNDERPADS AND DIAPERS) ×2 IMPLANT
WATER STERILE IRR 1000ML POUR (IV SOLUTION) ×2 IMPLANT
YANKAUER SUCT BULB TIP NO VENT (SUCTIONS) IMPLANT

## 2012-10-31 NOTE — Anesthesia Preprocedure Evaluation (Addendum)
Anesthesia Evaluation  Patient identified by MRN, date of birth, ID band Patient awake    Reviewed: Allergy & Precautions, H&P , NPO status , Patient's Chart, lab work & pertinent test results  Airway Mallampati: II TM Distance: >3 FB Neck ROM: full    Dental no notable dental hx. (+) Teeth Intact and Dental Advisory Given   Pulmonary neg pulmonary ROS,  breath sounds clear to auscultation  Pulmonary exam normal       Cardiovascular Exercise Tolerance: Good hypertension, Pt. on medications Rhythm:regular Rate:Normal     Neuro/Psych MS.  Remission x 18 months negative neurological ROS  negative psych ROS   GI/Hepatic negative GI ROS, Neg liver ROS,   Endo/Other  negative endocrine ROS  Renal/GU negative Renal ROS  negative genitourinary   Musculoskeletal   Abdominal   Peds  Hematology negative hematology ROS (+)   Anesthesia Other Findings   Reproductive/Obstetrics negative OB ROS                          Anesthesia Physical Anesthesia Plan  ASA: III  Anesthesia Plan: General   Post-op Pain Management:    Induction: Intravenous  Airway Management Planned: LMA  Additional Equipment:   Intra-op Plan:   Post-operative Plan:   Informed Consent: I have reviewed the patients History and Physical, chart, labs and discussed the procedure including the risks, benefits and alternatives for the proposed anesthesia with the patient or authorized representative who has indicated his/her understanding and acceptance.   Dental Advisory Given  Plan Discussed with: CRNA and Surgeon  Anesthesia Plan Comments:         Anesthesia Quick Evaluation

## 2012-10-31 NOTE — Anesthesia Postprocedure Evaluation (Signed)
  Anesthesia Post-op Note  Patient: Tiffany Barnes  Procedure(s) Performed: Procedure(s) (LRB): IRRIGATION AND DEBRIDEMENT BILATERAL LOWER EXTREMITIES WITH SURGICAL PREP AND PLACEMENT OF ACELL AND VAC (Bilateral)  Patient Location: PACU  Anesthesia Type: General  Level of Consciousness: awake and alert   Airway and Oxygen Therapy: Patient Spontanous Breathing  Post-op Pain: mild  Post-op Assessment: Post-op Vital signs reviewed, Patient's Cardiovascular Status Stable, Respiratory Function Stable, Patent Airway and No signs of Nausea or vomiting  Last Vitals:  Filed Vitals:   10/31/12 1528  BP:   Pulse: 82  Temp: 36.6 C  Resp: 15    Post-op Vital Signs: stable   Complications: No apparent anesthesia complications

## 2012-10-31 NOTE — Anesthesia Procedure Notes (Signed)
Procedure Name: LMA Insertion Date/Time: 10/31/2012 2:27 PM Performed by: Renella Cunas D Pre-anesthesia Checklist: Patient identified, Emergency Drugs available, Suction available and Patient being monitored Patient Re-evaluated:Patient Re-evaluated prior to inductionOxygen Delivery Method: Circle System Utilized Preoxygenation: Pre-oxygenation with 100% oxygen Intubation Type: IV induction Ventilation: Mask ventilation without difficulty LMA: LMA inserted LMA Size: 4.0 Number of attempts: 1 Airway Equipment and Method: bite block Placement Confirmation: positive ETCO2 Tube secured with: Tape Dental Injury: Teeth and Oropharynx as per pre-operative assessment

## 2012-10-31 NOTE — Transfer of Care (Signed)
Immediate Anesthesia Transfer of Care Note  Patient: Tiffany Barnes  Procedure(s) Performed: Procedure(s) (LRB): IRRIGATION AND DEBRIDEMENT BILATERAL LOWER EXTREMITIES WITH SURGICAL PREP AND PLACEMENT OF ACELL AND VAC (Bilateral)  Patient Location: PACU  Anesthesia Type: General  Level of Consciousness: awake, alert  and oriented  Airway & Oxygen Therapy: Patient Spontanous Breathing and Patient connected to face mask oxygen  Post-op Assessment: Report given to PACU RN and Post -op Vital signs reviewed and stable  Post vital signs: Reviewed and stable  Complications: No apparent anesthesia complications

## 2012-10-31 NOTE — Brief Op Note (Signed)
10/31/2012  3:14 PM  PATIENT:  Tiffany Barnes  57 y.o. female  PRE-OPERATIVE DIAGNOSIS:  ULCERS OF THE LOWER LEGS  POST-OPERATIVE DIAGNOSIS:  ULCERS OF THE LOWER LEGS  PROCEDURE:  Procedure(s): IRRIGATION AND DEBRIDEMENT BILATERAL LOWER EXTREMITIES WITH SURGICAL PREP AND PLACEMENT OF ACELL AND VAC (Bilateral)  SURGEON:  Surgeon(s) and Role:    * Claire Sanger, DO - Primary  PHYSICIAN ASSISTANT: Shawn Rayburn, PA  ASSISTANTS: none   ANESTHESIA:   general  EBL:  Total I/O In: 1000 [I.V.:1000] Out: -   BLOOD ADMINISTERED:none  DRAINS: none   LOCAL MEDICATIONS USED:  NONE  SPECIMEN:  No Specimen  DISPOSITION OF SPECIMEN:  N/A  COUNTS:  YES  TOURNIQUET:  * No tourniquets in log *  DICTATION: .Dragon Dictation  PLAN OF CARE: Discharge to home after PACU  PATIENT DISPOSITION:  PACU - hemodynamically stable.   Delay start of Pharmacological VTE agent (>24hrs) due to surgical blood loss or risk of bleeding: no

## 2012-10-31 NOTE — Op Note (Signed)
Procedure Note  Pre-operative Diagnosis: bilateral lower extremity ulcers  Post-operative Diagnosis: same  Indications: ulcers  Procedure: Placement of Acell and the VAC for bilateral leg ulcers (1000gm and 10 x 15 cm sheet)  Anesthesia: general  Procedure Details  The procedure, risks and complications have been discussed in detail (including, but not limited to airway compromise, infection, bleeding) with the patient, and the patient has signed consent to the procedure.  The skin was sterilely prepped and draped over the affected area in the usual fashion. A time out was called and all information was confirmed to be correct.  The #10 blade was used to excise the edges of skin and entire base of the wound.  Hemostasis was achieved with pressure.  The Acell powder was applied and then the sheet.  The sheet was prepared according to the manufacture guidelines.  The sheet was tacked in place with 5-0 vicryl.  Adaptic and surgilube was applied.  The adapatic was stapled in place.  The VAC was placed on the right leg with Iobane to secure it and creat a seal.  The leg was then wrapped with a kerlex and an ACE wrap. The VAC was set at 100 mmHg pressure.  The adaptic was also applied to the left leg with surgilube and wrapped with a kerlex and ACE wrap. The patient was observed until stable.  EBL: nil cc's  Condition: Tolerated procedure well and Stable  Complications: none.

## 2012-10-31 NOTE — Interval H&P Note (Signed)
History and Physical Interval Note:  10/31/2012 2:01 PM  Tiffany Barnes  has presented today for surgery, with the diagnosis of ULCERS OF THE LOWER LEGS  The various methods of treatment have been discussed with the patient and family. After consideration of risks, benefits and other options for treatment, the patient has consented to  Procedure(s): IRRIGATION AND DEBRIDEMENT BILATERAL LOWER EXTREMITIES WITH POSSIBLE SURGICAL PREP AND PLACEMENT OF ACELL AND VAC (Bilateral) as a surgical intervention .  The patient's history has been reviewed, patient examined, no change in status, stable for surgery.  I have reviewed the patient's chart and labs.  Questions were answered to the patient's satisfaction.     SANGER,Qaadir Kent
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
## 2012-11-01 ENCOUNTER — Encounter (HOSPITAL_BASED_OUTPATIENT_CLINIC_OR_DEPARTMENT_OTHER): Payer: Self-pay | Admitting: Plastic Surgery

## 2012-11-07 NOTE — Progress Notes (Signed)
[facility]  NAME:  RAYLEN, KEN                ACCOUNT NO.:  [id number]  MEDICAL RECORD NO.:  [id number]      DATE OF BIRTH:  [birth year]  PHYSICIAN:  Wayland Denis, DO       VISIT DATE:  11/07/2012                                  OFFICE VISIT   The patient is a 57 year old female, who is here for followup on her bilateral lower extremity ulcers.  She underwent surgical debridement with A-Cell placement.  The a right leg had a VAC and the left leg 4 x 4, KY dressing changes every other day.  The left leg has dried out a little bit, so we will increase to every day KY dressing changes.  The right leg shows severe irritation of the skin around where the [facility] dressing was located, so we will hold the [facility] for 1 week and do every other day dressing changes with KY jelly, and then have her follow up in 1 week.     Wayland Denis, DO     CS/MEDQ  D:  11/07/2012  T:  11/07/2012  Job:  [id number]

## 2012-11-14 NOTE — Progress Notes (Signed)
[facility]  NAME:  Tiffany Barnes, Tiffany Barnes                ACCOUNT NO.:  [id number]  MEDICAL RECORD NO.:  [id number]      DATE OF BIRTH:  15-Feb-1956  PHYSICIAN:  Wayland Denis, DO       VISIT DATE:  11/14/2012                                  OFFICE VISIT   The patient is a 57 year old female who is here for followup on her bilateral lower extremity ulcers.  She has been using KY on the left and a VAC on the right.  She had a little hematoma formation under the left ACell, the right ACell seems to have incorporated very well.  We held the [facility] last week as her skin was very irritated on the right side so we will continue with that on the right with fresh Adaptic and KY and on the left she is to do dressing changes every other day and/or every day if it is dry and keep her leg elevated, and we will see her back in a week.     Wayland Denis, DO     CS/MEDQ  D:  11/14/2012  T:  11/14/2012  Job:  [id number]

## 2012-11-21 NOTE — Progress Notes (Signed)
[facility]  NAME:  BITA, Tiffany Barnes                ACCOUNT NO.:  [id number]  MEDICAL RECORD NO.:  [id number]      DATE OF BIRTH:  1955-07-11  PHYSICIAN:  Wayland Denis, DO       VISIT DATE:  11/21/2012                                  OFFICE VISIT   The patient is a 57 year old female who is here for followup on her bilateral lower extremity ulcers from trauma.  She underwent irrigation and debridement, ACell placed in the OR.  She was doing really well, showing some good signs of improvement, but it looks like she is really irritated around the wounds and has a lot of swelling and periwound irritation likely related to a combination of the fluid and the tape from the [facility].  With that in mind, we will use a silver alginate on her right with a wrap elevation, multivitamin, vitamin C, zinc, and then on the left KY jelly and a wrap, and we will see her back in a week.  She is to change the right leg every other day and the left leg every day.     Wayland Denis, DO     CS/MEDQ  D:  11/21/2012  T:  11/21/2012  Job:  [id number]

## 2012-11-24 ENCOUNTER — Other Ambulatory Visit: Payer: Self-pay

## 2012-11-24 MED ORDER — FINGOLIMOD HCL 0.5 MG PO CAPS: 0.5000 mg | ORAL_CAPSULE | Freq: Every day | ORAL | Status: DC

## 2012-11-28 ENCOUNTER — Encounter (HOSPITAL_BASED_OUTPATIENT_CLINIC_OR_DEPARTMENT_OTHER): Payer: Managed Care, Other (non HMO) | Attending: Plastic Surgery

## 2012-11-28 DIAGNOSIS — L97909 Non-pressure chronic ulcer of unspecified part of unspecified lower leg with unspecified severity: Secondary | ICD-10-CM | POA: Insufficient documentation

## 2012-11-28 NOTE — Progress Notes (Signed)
[facility]  NAME:  Tiffany Barnes, Tiffany Barnes                ACCOUNT NO.:  [id number]  MEDICAL RECORD NO.:  [id number]      DATE OF BIRTH:  04-23-1956  PHYSICIAN:  Wayland Denis, DO       VISIT DATE:  11/28/2012                                  OFFICE VISIT   The patient is a 57 year old female, who is here for followup on her bilateral lower extremity ulcers from trauma.  The periwound area is doing a little bit better, less red and irritated looking.  There has been no change in her medications or social history.  She is still on the amoxicillin.  On exam, she is alert, very pleasant.  She agrees that it is looking a little bit better.  There is quite a bit of film over the wounds that was removed with a curette.  We will do Silvercel this week and hold on the VAC.  We will see her back in a week.  She may need more debridement and a cell placement depending on how it progresses over the next week.     Wayland Denis, DO     CS/MEDQ  D:  11/28/2012  T:  11/28/2012  Job:  [id number]

## 2012-12-05 NOTE — Progress Notes (Signed)
[facility]  NAME:  [name], Tiffany Barnes                ACCOUNT NO.:  [id number]  MEDICAL RECORD NO.:  [id number]      DATE OF BIRTH:  1956/04/29  PHYSICIAN:  Wayland Denis, DO       VISIT DATE:  12/05/2012                                  OFFICE VISIT   The patient is a 57 year old female who is here for followup on her bilateral lower extremity ulcers.  She looks much better today with left periwound irritation and swelling.  The wound itself looks and smells better.  They were debrided again with the curette.  We will continue with the SilverCel and the Tisseel on the periwound area with the SilverCel on the wound.  Elevation, healthy eating, multivitamin, vitamin C, zinc, and we will see her back in a week.     Wayland Denis, DO     CS/MEDQ  D:  12/05/2012  T:  12/05/2012  Job:  [id number]

## 2012-12-13 NOTE — Progress Notes (Signed)
[facility]  NAME:  Tiffany Barnes, Tiffany Barnes                ACCOUNT NO.:  [id number]  MEDICAL RECORD NO.:  [id number]      DATE OF BIRTH:  [birth year]  PHYSICIAN:  Wayland Denis, DO            VISIT DATE:                                  OFFICE VISIT   The patient is a 57 year old white female, here for followup on her bilateral lower extremity ulcers.  The periwound area is much improved today.  She still has ulcerations, but she does not have film or fibrous tissue over it like she had previously.  The swelling has improved as well.  She is alert, oriented, cooperative, not in any acute distress. She is pleasant.  Pupils are equal.  Extraocular muscles are intact. She does not have any cervical lymphadenopathy.  Her breathing is unlabored.  She is pleased with the progress as well.  We will continue with Fibracol on the right, Hydrogel on the left, and see her back in 1 week.  In the meantime, she needs to continue with multivitamin, vitamin C, zinc, and elevation and increasing her protein.     Wayland Denis, DO     CS/MEDQ  D:  12/12/2012  T:  12/13/2012  Job:  [id number]

## 2012-12-26 ENCOUNTER — Encounter (HOSPITAL_BASED_OUTPATIENT_CLINIC_OR_DEPARTMENT_OTHER): Payer: Managed Care, Other (non HMO) | Attending: Plastic Surgery

## 2012-12-26 DIAGNOSIS — L97809 Non-pressure chronic ulcer of other part of unspecified lower leg with unspecified severity: Secondary | ICD-10-CM | POA: Insufficient documentation

## 2013-01-03 NOTE — Progress Notes (Signed)
[facility]  NAME:  Tiffany Barnes, Tiffany Barnes                ACCOUNT NO.:  [id number]  MEDICAL RECORD NO.:  [id number]      DATE OF BIRTH:  [birth year]  PHYSICIAN:  Wayland Denis, DO       VISIT DATE:  01/02/2013                                  OFFICE VISIT   The patient is a 57 year old female who is here for followup on her bilateral lower extremity ulcers.  They are improving with filling in. The periwound area is markedly improved.  She is pleased with the progress.  We will continue with local dressing care and see what the hold up is to apply for Oasis.  She needs to continue with elevation and protein, and we will see her back in 1 week.     Wayland Denis, DO     CS/MEDQ  D:  01/02/2013  T:  01/03/2013  Job:  [id number]

## 2013-01-05 ENCOUNTER — Encounter: Payer: Self-pay | Admitting: Nurse Practitioner

## 2013-01-05 ENCOUNTER — Ambulatory Visit (INDEPENDENT_AMBULATORY_CARE_PROVIDER_SITE_OTHER): Payer: 59 | Admitting: Nurse Practitioner

## 2013-01-05 VITALS — BP 145/71 | HR 64 | Ht 64.5 in | Wt 160.0 lb

## 2013-01-05 DIAGNOSIS — R269 Unspecified abnormalities of gait and mobility: Secondary | ICD-10-CM

## 2013-01-05 DIAGNOSIS — G35 Multiple sclerosis: Secondary | ICD-10-CM

## 2013-01-05 DIAGNOSIS — Z79899 Other long term (current) drug therapy: Secondary | ICD-10-CM

## 2013-01-05 NOTE — Progress Notes (Signed)
I reviewed note and agree with plan.   Suanne Marker, MD 01/05/2013, 5:38 PM Certified in Neurology, Neurophysiology and Neuroimaging  [facility] [address], [address] [place], [place] [address] [phone]
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
## 2013-01-05 NOTE — Patient Instructions (Addendum)
Will check labs Continue Gilenya  Continue with cane for gait abnormality F/U in 6 to 8 months and prn

## 2013-01-06 ENCOUNTER — Encounter: Payer: Self-pay | Admitting: Nurse Practitioner

## 2013-01-06 LAB — CBC WITH DIFFERENTIAL
Basophils Absolute: 0 10*3/uL (ref 0.0–0.2)
Eosinophils Absolute: 0.1 10*3/uL (ref 0.0–0.4)
HCT: 37.8 % (ref 34.0–46.6)
Lymphocytes Absolute: 0.2 10*3/uL — ABNORMAL LOW (ref 0.7–3.1)
MCH: 27.8 pg (ref 26.6–33.0)
MCHC: 33.9 g/dL (ref 31.5–35.7)
MCV: 82 fL (ref 79–97)
Monocytes Absolute: 0.5 10*3/uL (ref 0.1–0.9)
Neutrophils Absolute: 2.6 10*3/uL (ref 1.4–7.0)
RDW: 15.2 % (ref 12.3–15.4)

## 2013-01-06 LAB — COMPREHENSIVE METABOLIC PANEL
AST: 17 IU/L (ref 0–40)
Alkaline Phosphatase: 155 IU/L — ABNORMAL HIGH (ref 39–117)
BUN/Creatinine Ratio: 31 — ABNORMAL HIGH (ref 9–23)
CO2: 29 mmol/L (ref 18–29)
Chloride: 105 mmol/L (ref 96–108)
Creatinine, Ser: 0.61 mg/dL (ref 0.57–1.00)
Globulin, Total: 2.6 g/dL (ref 1.5–4.5)
Sodium: 144 mmol/L (ref 134–144)

## 2013-01-09 NOTE — Progress Notes (Signed)
[facility]  NAME:  Tiffany Barnes, Tiffany Barnes                ACCOUNT NO.:  [id number]  MEDICAL RECORD NO.:  [id number]      DATE OF BIRTH:  1955-07-11  PHYSICIAN:  Wayland Denis, DO       VISIT DATE:  01/09/2013                                  OFFICE VISIT   The patient is a 57 year old female who is here for followup on her bilateral lower extremity ulcers secondary to trauma.  She underwent debridement with ACell placement in the [facility] and initially did well but had a reaction to the [place].  So we had stopped the VAC.  She has filled in very well.  There is no sign of infection.  The redness is markedly improved, but she is epithelializing very slowly.  So we will continue with local dressings, elevation, silver alginate, multivitamin, vitamin C, zinc, and protein.  We will check a prealbumin.  We may need to get vascular studies, and then additionally we will apply for Oasis.     Wayland Denis, DO     CS/MEDQ  D:  01/09/2013  T:  01/09/2013  Job:  [id number]

## 2013-01-12 NOTE — Progress Notes (Signed)
Quick Note:  Spoke to patient and relayed lab results, with elevated ALP, per Eber Jones. She will follow up with her PCP. ______

## 2013-01-16 NOTE — Progress Notes (Signed)
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  Overall, she is doing better.  She has got a lot of exudate on that and I debrided off today.  The periwound area has improved but progress is slow.  We did apply for Oasis.  She does qualify.  She will have some out of pocket 20% and that was explained to her.  She does want to move ahead with trying that week after next which comes back.  In the meantime, we will continue with silver alginate and see her back in 2 weeks.     Wayland Denis, DO     CS/MEDQ  D:  01/16/2013  T:  01/16/2013  Job:  [id number]

## 2013-01-30 ENCOUNTER — Encounter (HOSPITAL_BASED_OUTPATIENT_CLINIC_OR_DEPARTMENT_OTHER): Payer: Managed Care, Other (non HMO) | Attending: Plastic Surgery

## 2013-01-30 DIAGNOSIS — L97909 Non-pressure chronic ulcer of unspecified part of unspecified lower leg with unspecified severity: Secondary | ICD-10-CM | POA: Insufficient documentation

## 2013-01-30 DIAGNOSIS — I739 Peripheral vascular disease, unspecified: Secondary | ICD-10-CM | POA: Insufficient documentation

## 2013-01-31 NOTE — Progress Notes (Signed)
[facility]  NAME:  Tiffany Barnes, Tiffany Barnes                ACCOUNT NO.:  [id number]  MEDICAL RECORD NO.:  [id number]      DATE OF BIRTH:  07/29/1955  PHYSICIAN:  Wayland Denis, DO       VISIT DATE:  01/30/2013                                  OFFICE VISIT   The patient is a 57 year old female who is here for followup on bilateral lower extremity ulcers, a combination of trauma and peripheral vascular disease.  She has undergone debridement in the past with placement of ACell and the VAC.  She had a hard time with the [facility] dressing, so we did have to stop that.  She is now using silver alginate with some improvement, although slow.  There has been no change in her medications or social history.  On exam, she is alert, oriented, cooperative, not in any acute distress. She is very pleasant.  Pupils are equal.  Extraocular muscles are intact.  No cervical lymphadenopathy.  The wounds are improving slowly with a decrease in depth and dimensions.  We will continue with the silver alginate and see her back in a week.  In the meantime, she is encouraged to continue elevation, multivitamin, vitamin C, zinc, and protein.     Wayland Denis, DO     CS/MEDQ  D:  01/30/2013  T:  01/31/2013  Job:  [id number]

## 2013-02-14 NOTE — Progress Notes (Signed)
[facility]  NAME:  Tiffany Barnes, Tiffany Barnes                ACCOUNT NO.:  [id number]  MEDICAL RECORD NO.:  [id number]      DATE OF BIRTH:  1956-04-16  PHYSICIAN:  Wayland Denis, DO            VISIT DATE:                                  OFFICE VISIT   The patient is a 57 year old female here for followup on her bilateral lower extremity chronic ulcers.  She has undergone debridement in the past.  She showed some signs of improvement but very slow.  She is not smoking.  She states she is elevating them and taking her protein and multivitamin.  I would like to switch her to collagen.  If we do not see improvement in the next few weeks, then she is likely going to need to return to the operating room.  She acknowledges understanding.  There is no change in her medications or social history otherwise.     Wayland Denis, DO     CS/MEDQ  D:  02/13/2013  T:  02/14/2013  Job:  [id number]

## 2013-02-20 ENCOUNTER — Telehealth: Payer: Self-pay

## 2013-02-20 NOTE — Telephone Encounter (Signed)
Lori from [employer] called stating Gilenya will need a new Prior Auth for the patient's current plan.  I have submitted all requested information, pending ins response.

## 2013-03-06 ENCOUNTER — Encounter (HOSPITAL_BASED_OUTPATIENT_CLINIC_OR_DEPARTMENT_OTHER): Payer: Managed Care, Other (non HMO) | Attending: Plastic Surgery

## 2013-03-06 DIAGNOSIS — L97809 Non-pressure chronic ulcer of other part of unspecified lower leg with unspecified severity: Secondary | ICD-10-CM | POA: Insufficient documentation

## 2013-03-24 DIAGNOSIS — Z0289 Encounter for other administrative examinations: Secondary | ICD-10-CM

## 2013-03-27 ENCOUNTER — Encounter (HOSPITAL_BASED_OUTPATIENT_CLINIC_OR_DEPARTMENT_OTHER): Payer: Managed Care, Other (non HMO) | Attending: Plastic Surgery

## 2013-03-27 DIAGNOSIS — I83893 Varicose veins of bilateral lower extremities with other complications: Secondary | ICD-10-CM | POA: Insufficient documentation

## 2013-03-27 DIAGNOSIS — L97909 Non-pressure chronic ulcer of unspecified part of unspecified lower leg with unspecified severity: Secondary | ICD-10-CM | POA: Insufficient documentation

## 2013-03-27 DIAGNOSIS — Z79899 Other long term (current) drug therapy: Secondary | ICD-10-CM | POA: Insufficient documentation

## 2013-04-10 ENCOUNTER — Telehealth: Payer: Self-pay

## 2013-04-10 NOTE — Telephone Encounter (Signed)
I called and let patient know that form is ready to be picked up. It is in [facility] records. She will come today. She must sign a release of information when she arrives.
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
## 2013-04-18 ENCOUNTER — Telehealth: Payer: Self-pay

## 2013-04-18 NOTE — Telephone Encounter (Signed)
I called patient and let her know her disability forms have been faxed and mailed today.

## 2013-05-01 ENCOUNTER — Encounter (HOSPITAL_BASED_OUTPATIENT_CLINIC_OR_DEPARTMENT_OTHER): Payer: Managed Care, Other (non HMO) | Attending: Plastic Surgery

## 2013-05-01 DIAGNOSIS — I872 Venous insufficiency (chronic) (peripheral): Secondary | ICD-10-CM | POA: Insufficient documentation

## 2013-05-01 DIAGNOSIS — L97809 Non-pressure chronic ulcer of other part of unspecified lower leg with unspecified severity: Secondary | ICD-10-CM | POA: Insufficient documentation
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
## 2013-05-09 NOTE — Progress Notes (Signed)
[facility]  NAME:  Tiffany Barnes, Tiffany Barnes                ACCOUNT NO.:  [id number]  MEDICAL RECORD NO.:  [id number]      DATE OF BIRTH:  Dec 17, 1955  PHYSICIAN:  Wayland Denis, DO       VISIT DATE:  05/08/2013                                  OFFICE VISIT   The patient is a 57 year old female, who is here for followup on her right lower extremity ulcers.  The ulcers are of little bit smaller but there is a lot of periwound irritation.  She had the [place] on all week and that seemed to have irritated it.  It is likely the drainage got spread onto her surrounding skin causing irritation.  There has been no change in his medications, social history.  On exam, she is alert, oriented, cooperative, not in any acute distress. She is pleasant. Her breathing is unlabored.  Her heart rate is regular.  Pulses are equal and strong.  The wound is described above.  We will continue with local dressings. We will use silver alginate everyday or every other day dressing changes, just an Ace wrap, elevation, multivitamin, vitamin C, zinc, and we will see her back in 1 week.     Wayland Denis, DO     CS/MEDQ  D:  05/08/2013  T:  05/09/2013  Job:  [id number]

## 2013-05-15 NOTE — Progress Notes (Signed)
[facility]  NAME:  AMORETTE, CHARRETTE                ACCOUNT NO.:  [id number]  MEDICAL RECORD NO.:  [id number]      DATE OF BIRTH:  March 06, 1956  PHYSICIAN:  Wayland Denis, DO       VISIT DATE:  05/15/2013                                  OFFICE VISIT   HISTORY OF PRESENT ILLNESS:  The patient is a 57 year old female who is here for followup of her bilateral lower extremity chronic venous insufficiency ulcers.  She had undergone debridement in the past and she is very slow to progress at this point.  There is not much change.  Her cultures did grow 2 different kind of bacteria, but fortunately sensitive to Cipro.  PHYSICAL EXAMINATION:  GENERAL:  On exam, she is alert, oriented, cooperative, not in any acute distress. LUNGS:  Her breathing is unlabored. HEART:  Rate is regular. EXTREMITIES:  Her wounds are not significantly different.  We will start her on Cipro.  We will switch her to collagen and if there is not an improvement in the next few weeks then we may need to go back to the OR for more debridement and ACell placement.     Wayland Denis, DO     CS/MEDQ  D:  05/15/2013  T:  05/15/2013  Job:  [id number]

## 2013-05-22 NOTE — Progress Notes (Signed)
[facility]  NAME:  Tiffany Barnes, Tiffany Barnes                ACCOUNT NO.:  [id number]  MEDICAL RECORD NO.:  [id number]      DATE OF BIRTH:  28-Jun-1955  PHYSICIAN:  Wayland Denis, DO       VISIT DATE:  05/22/2013                                  OFFICE VISIT   HISTORY OF PRESENT ILLNESS:  The patient is a 57 year old female, who is here for followup on her bilateral lower extremity ulcers.  They are a little bit better this week, but there is still some periwound redness and her progress has been extremely slow.  There has been no change in her medications other than adding the last week.  REVIEW OF SYSTEMS:  Negative.  PHYSICAL EXAMINATION:  She is alert, oriented, cooperative, not in any acute distress.  She is very pleasant.  Her pupils are equal. Extraocular muscles are intact.  No lymphadenopathy.  Her breathing is unlabored.  Her heart rate is regular.  Pulses are strong.  She still has the varicose veins, and periwound irritation around both wounds, and they are superficial but still hard looking and  slow to progress.  At this point, I think she would benefit from debridement in the OR with ACell placement.  Continue with elevation, multivitamin, vitamin C, zinc, and protein intake, and we will see her back in 1 week.     Wayland Denis, DO     CS/MEDQ  D:  05/22/2013  T:  05/22/2013  Job:  [id number]

## 2013-05-29 ENCOUNTER — Encounter (HOSPITAL_BASED_OUTPATIENT_CLINIC_OR_DEPARTMENT_OTHER): Payer: Managed Care, Other (non HMO) | Attending: Plastic Surgery

## 2013-05-29 DIAGNOSIS — L97809 Non-pressure chronic ulcer of other part of unspecified lower leg with unspecified severity: Secondary | ICD-10-CM | POA: Insufficient documentation

## 2013-05-29 DIAGNOSIS — I872 Venous insufficiency (chronic) (peripheral): Secondary | ICD-10-CM | POA: Insufficient documentation

## 2013-05-30 NOTE — Progress Notes (Signed)
[facility]  NAME:  Tiffany Barnes, Drishti                ACCOUNT NO.:  [id number]  MEDICAL RECORD NO.:  [id number]      DATE OF BIRTH:  [birth year]  PHYSICIAN:  Wayland Denislaire Sanger, DO       VISIT DATE:  05/29/2013                                  OFFICE VISIT   The patient is a 58 year old female, who is here for followup on her bilateral lower extremity chronic venous insufficiency ulcers.  She has been using silver collagen on the area.  It is showing some signs of improvement, but surrounding skin is really irritated still.  The depth has improved and the overall size is a little bit better.  There has been no change in her medications or social history.  REVIEW OF SYSTEMS:  Negative.  On exam, GENERAL:  She is alert, oriented, cooperative, not in any acute distress.  She is pleasant. HEENT:  Pupils are equal. LUNGS:  Breathing is unlabored. HEART:  Heart rate is regular.  I am going to take the silver out and just do collagen with TCA around the periwound area with elevation, multivitamin, vitamin C, zinc and see how she does and then we will see her back in 2 weeks per her request.     Wayland Denislaire Sanger, DO     CS/MEDQ  D:  05/29/2013  T:  05/30/2013  Job:  [id number]

## 2013-06-12 ENCOUNTER — Telehealth: Payer: Self-pay | Admitting: Diagnostic Neuroimaging

## 2013-06-12 MED ORDER — FINGOLIMOD HCL 0.5 MG PO CAPS: 0.5000 mg | ORAL_CAPSULE | Freq: Every day | ORAL | Status: DC

## 2013-06-12 NOTE — Telephone Encounter (Signed)
Rx has been sent  

## 2013-06-12 NOTE — Progress Notes (Signed)
[facility]  NAME:  Tiffany Barnes, Tiffany Barnes                ACCOUNT NO.:  [id number]  MEDICAL RECORD NO.:  [id number]      DATE OF BIRTH:  [birth year]  PHYSICIAN:  Wayland Denis, DO       VISIT DATE:  06/12/2013                                  OFFICE VISIT   The patient is a 58 year old female, who is here for followup on her bilateral lower extremity chronic venous insufficiency ulcer secondary to trauma.  She is doing incredibly well and is finally showing signs of marked improvement with one ulcer on the right leg being completely healed.  There has been no change in her medications or social history.  On exam, she is alert, oriented, and cooperative, not in any acute distress.  Pupils are equal.  Extraocular movements are intact.  No cervical lymphadenopathy.  Her breathing is unlabored.  Her heart rate is regular.  The wounds are markedly improved and those are noted in the note.  We will continue with the collagen and see her back in 2 weeks.     Wayland Denis, DO     CS/MEDQ  D:  06/12/2013  T:  06/12/2013  Job:  [id number]

## 2013-06-12 NOTE — Telephone Encounter (Signed)
Patient requesting Gilenya refills to be sent to her [facility].

## 2013-06-26 ENCOUNTER — Encounter (HOSPITAL_BASED_OUTPATIENT_CLINIC_OR_DEPARTMENT_OTHER): Payer: Managed Care, Other (non HMO) | Attending: Plastic Surgery

## 2013-06-26 DIAGNOSIS — I872 Venous insufficiency (chronic) (peripheral): Secondary | ICD-10-CM | POA: Insufficient documentation

## 2013-06-26 DIAGNOSIS — L97809 Non-pressure chronic ulcer of other part of unspecified lower leg with unspecified severity: Secondary | ICD-10-CM | POA: Insufficient documentation

## 2013-07-11 ENCOUNTER — Ambulatory Visit: Payer: 59 | Admitting: Diagnostic Neuroimaging

## 2013-07-24 ENCOUNTER — Encounter (HOSPITAL_BASED_OUTPATIENT_CLINIC_OR_DEPARTMENT_OTHER): Payer: Managed Care, Other (non HMO) | Attending: Plastic Surgery

## 2013-07-24 DIAGNOSIS — L97809 Non-pressure chronic ulcer of other part of unspecified lower leg with unspecified severity: Secondary | ICD-10-CM | POA: Insufficient documentation

## 2013-07-25 NOTE — Progress Notes (Signed)
[facility]  NAME:  Tiffany Barnes, Tiffany Barnes                ACCOUNT NO.:  [id number]  MEDICAL RECORD NO.:  [id number]      DATE OF BIRTH:  02/28/1956  PHYSICIAN:  Wayland Denis, DO       VISIT DATE:  07/24/2013                                  OFFICE VISIT   The patient is a 58 year old female, who is here for followup on her bilateral lower extremity ulcers.  With great enthusiasm, she has completely healed her left leg ulcer and the lateral right leg ulcer. She has decreased the size of the right ulcer.  Her swelling has improved.  The redness has improved.  There has been no change in her medications or social history.  She has been using Eucerin cream on her legs and the collagen on the wound.  REVIEW OF SYSTEMS:  Negative.  PHYSICAL EXAMINATION:  GENERAL:  She is alert, oriented, cooperative, not in any acute distress.  She is pleasant. HEENT:  Pupils are equal. ABDOMEN:  Soft. LUNGS:  Breathing is unlabored. CARDIOVASCULAR:  Heart rate is regular.  We will continue with the collagen, Eucerin elevation, protein, multivitamin, vitamin C, zinc, and we will see her back in a week.     Wayland Denis, DO     CS/MEDQ  D:  07/24/2013  T:  07/25/2013  Job:  [id number]

## 2013-07-27 ENCOUNTER — Other Ambulatory Visit: Payer: Self-pay

## 2013-07-27 MED ORDER — PAROXETINE HCL 40 MG PO TABS: 40.0000 mg | ORAL_TABLET | Freq: Every day | ORAL | Status: DC

## 2013-08-28 ENCOUNTER — Encounter (HOSPITAL_BASED_OUTPATIENT_CLINIC_OR_DEPARTMENT_OTHER): Payer: Managed Care, Other (non HMO) | Attending: Plastic Surgery

## 2013-08-28 DIAGNOSIS — L97809 Non-pressure chronic ulcer of other part of unspecified lower leg with unspecified severity: Secondary | ICD-10-CM | POA: Insufficient documentation

## 2013-08-29 NOTE — Progress Notes (Signed)
[facility]  NAME:  Tiffany Barnes, Tiffany Barnes                ACCOUNT NO.:  [id number]  MEDICAL RECORD NO.:  [id number]      DATE OF BIRTH:  May 27, 1955  PHYSICIAN:  Wayland Denis, DO       VISIT DATE:  08/28/2013                                  OFFICE VISIT   The patient is a 59 year old female, who is here for a followup on her right leg ulcer.  She has been using collagen at home.  The left one is completely healed.  The right lateral wound has completely healed.  The one in the central portion of the right leg is still present, looks a little bit of blood eschar on the area.  There has been no change in her medications or social history.  PHYSICAL EXAMINATION:  GENERAL:  She is alert, oriented, cooperative, not in any acute distress.  She is very pleasant. HEENT:  Pupils are equal.  Extraocular muscles are intact.  No cervical lymphadenopathy. LUNGS:  Her breathing is unlabored. HEART:  Her heart rate is regular.  The wound is on the right leg and has some blood that was debrided and Endoform was placed.  She has enough to do dressing change every other day with the Endoform or every third day and then when that runs out she will go back to the collagen and we will see her back in 4 weeks per her request.     Wayland Denis, DO     CS/MEDQ  D:  08/28/2013  T:  08/29/2013  Job:  [id number]

## 2013-09-18 DIAGNOSIS — L97809 Non-pressure chronic ulcer of other part of unspecified lower leg with unspecified severity: Secondary | ICD-10-CM | POA: Diagnosis not present

## 2013-09-25 ENCOUNTER — Encounter (HOSPITAL_BASED_OUTPATIENT_CLINIC_OR_DEPARTMENT_OTHER): Payer: Medicare HMO

## 2013-09-25 ENCOUNTER — Encounter (HOSPITAL_BASED_OUTPATIENT_CLINIC_OR_DEPARTMENT_OTHER): Payer: Medicare HMO | Attending: Plastic Surgery

## 2013-09-25 DIAGNOSIS — M7989 Other specified soft tissue disorders: Secondary | ICD-10-CM | POA: Insufficient documentation

## 2013-09-25 DIAGNOSIS — L97809 Non-pressure chronic ulcer of other part of unspecified lower leg with unspecified severity: Secondary | ICD-10-CM | POA: Insufficient documentation

## 2013-09-25 DIAGNOSIS — I872 Venous insufficiency (chronic) (peripheral): Secondary | ICD-10-CM | POA: Insufficient documentation

## 2013-09-25 NOTE — Progress Notes (Signed)
[facility]  NAME:  Valentino HueSMITH, Wednesday                     ACCOUNT NO.:  MEDICAL RECORD NO.:  [id number]      DATE OF BIRTH:  Dec 09, 1955  PHYSICIAN:  Wayland Denislaire Sanger, DO       VISIT DATE:  09/18/2013                                  OFFICE VISIT   The patient is a 58 year old female, who is here for followup on her right leg ulcer.  We did Endoform last week.  She had sharp irritation in the periwound area to the Endoform with a lot of redness.  REVIEW OF SYSTEMS:  Negative.  PHYSICAL EXAMINATION:  On exam, she is alert, oriented, and cooperative. She is not in any acute distress and she is very pleasant.  She does not want to use Endoform again.  Her breathing is unlabored. Her heart rate is regular.  The periwound area is very red and the wound is not much changed, but tender, so we will go back to the Promogran and do a Profore Lite and have her follow up in 1 week.     Wayland Denislaire Sanger, DO     CS/MEDQ  D:  09/18/2013  T:  09/19/2013  Job:  [id number]

## 2013-09-26 NOTE — Progress Notes (Signed)
[facility]  NAME:  Tiffany Barnes, Tiffany Barnes                ACCOUNT NO.:  [id number]  MEDICAL RECORD NO.:  [id number]      DATE OF BIRTH:  1956/02/16  PHYSICIAN:  Wayland Denis, DO       VISIT DATE:  09/25/2013                                  OFFICE VISIT   HISTORY:  The patient is a 58 year old female here for followup on her right leg ulcer.  She had silver collagen and a Profore placed last week and has done extremely well.  The redness has improved markedly.  She has a little bit of periwound superficial breakdown medially.  There has been no change in medications or social history.  She has not gotten her compression stockings yet.  PHYSICAL EXAMINATION:  She is alert, oriented, cooperative, not in any acute distress.  She is very pleasant.  ASSESSMENT:  The wound is described above.  The sizes are noted in the chart.  The plan will be for continued Profore Lite with collagen, elevation, get the compression stockings, continue with protein, multivitamins, and follow up in 1 week.     Wayland Denis, DO     CS/MEDQ  D:  09/25/2013  T:  09/26/2013  Job:  [id number]

## 2013-10-03 NOTE — Progress Notes (Signed)
[facility]  NAME:  Tiffany Barnes, Tiffany Barnes                     ACCOUNT NO.:  MEDICAL RECORD NO.:  [id number]      DATE OF BIRTH:  [birth year]  PHYSICIAN:  Wayland Denis, DO       VISIT DATE:  10/02/2013                                  OFFICE VISIT   SUBJECTIVE:  The patient is a 58 year old female, who is here for a followup on her right leg chronic venous insufficiency ulcer.  She is doing much better with the collagen and Profore Lite.  Her surrounding tissue has markedly improved.  There is less irritation and less swelling.  She still has not the compression stockings and she has no change in medications.  REVIEW OF SYSTEMS:  Negative.  OBJECTIVE:  GENERAL:  She is alert, oriented, cooperative, not in any acute distress.  She is very pleasant. EYES:  Pupils are equal. RESPIRATORY:  Her breathing is unlabored. HEART:  Rate is regular. EXTREMITIES:  She has actually a bit of swelling in her left leg.  Her right leg is doing very well.  Periwound area had markedly improved. The wound is clean and granulating well.  We will continue with collagen and Profore Lite and see her in follow up in 3 weeks and she will do dressing changes at home for the next 2 weeks.     Wayland Denis, DO     CS/MEDQ  D:  10/02/2013  T:  10/03/2013  Job:  [id number]

## 2013-10-09 ENCOUNTER — Encounter (INDEPENDENT_AMBULATORY_CARE_PROVIDER_SITE_OTHER): Payer: Self-pay

## 2013-10-09 ENCOUNTER — Encounter: Payer: Self-pay | Admitting: Diagnostic Neuroimaging

## 2013-10-09 ENCOUNTER — Ambulatory Visit (INDEPENDENT_AMBULATORY_CARE_PROVIDER_SITE_OTHER): Payer: Medicare HMO | Admitting: Diagnostic Neuroimaging

## 2013-10-09 VITALS — BP 142/80 | HR 47 | Ht 65.5 in | Wt 159.0 lb

## 2013-10-09 DIAGNOSIS — G35 Multiple sclerosis: Secondary | ICD-10-CM
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
## 2013-10-09 NOTE — Patient Instructions (Signed)
Continue gilenya.  I will check lab testing and MRI.

## 2013-10-10 ENCOUNTER — Telehealth: Payer: Self-pay | Admitting: Diagnostic Neuroimaging

## 2013-10-10 NOTE — Telephone Encounter (Signed)
Pt called states she has to have blood drawn from our office and advised that she is having an MRI done on the 29th of May and they are drawing blood also. Pt wants to know if [facility] can do it all at one time instead of having it done at two places. Please call pt concerning this matter. Thanks

## 2013-10-11 ENCOUNTER — Other Ambulatory Visit: Payer: Self-pay

## 2013-10-11 DIAGNOSIS — G35 Multiple sclerosis: Secondary | ICD-10-CM

## 2013-10-11 NOTE — Telephone Encounter (Signed)
Patient returned call and stated she's having bw drawn at [facility] where she's having her MRI.  Wanted to make sure additional bw needed from GI has been added to order.

## 2013-10-11 NOTE — Telephone Encounter (Signed)
Returned patient's call to inform added lab test needed by GI. Ok to have all labs drawn here at [facility] before MRI. No answer. Left message to return call.

## 2013-10-12 NOTE — Telephone Encounter (Signed)
Spoke to patient. Advised labs required by GI added. Patient said she will be in before 10/20/13 (date of MRI).

## 2013-10-17 ENCOUNTER — Other Ambulatory Visit (INDEPENDENT_AMBULATORY_CARE_PROVIDER_SITE_OTHER): Payer: Self-pay

## 2013-10-17 DIAGNOSIS — Z0289 Encounter for other administrative examinations: Secondary | ICD-10-CM

## 2013-10-18 LAB — HEPATIC FUNCTION PANEL
ALBUMIN: 3.8 g/dL (ref 3.5–5.5)
ALK PHOS: 131 IU/L — AB (ref 39–117)
ALT: 15 IU/L (ref 0–32)
AST: 19 IU/L (ref 0–40)
BILIRUBIN DIRECT: 0.18 mg/dL (ref 0.00–0.40)
BILIRUBIN TOTAL: 0.7 mg/dL (ref 0.0–1.2)
Total Protein: 6.3 g/dL (ref 6.0–8.5)

## 2013-10-18 LAB — CBC WITH DIFFERENTIAL
BASOS: 1 %
Basophils Absolute: 0 10*3/uL (ref 0.0–0.2)
Eos: 5 %
Eosinophils Absolute: 0.1 10*3/uL (ref 0.0–0.4)
HEMATOCRIT: 37.4 % (ref 34.0–46.6)
HEMOGLOBIN: 13 g/dL (ref 11.1–15.9)
LYMPHS ABS: 0.2 10*3/uL — AB (ref 0.7–3.1)
LYMPHS: 5 %
MCH: 29.1 pg (ref 26.6–33.0)
MCHC: 34.8 g/dL (ref 31.5–35.7)
MCV: 84 fL (ref 79–97)
MONOCYTES: 15 %
Monocytes Absolute: 0.5 10*3/uL (ref 0.1–0.9)
NEUTROS ABS: 2.3 10*3/uL (ref 1.4–7.0)
Neutrophils Relative %: 74 %
Platelets: 224 10*3/uL (ref 150–379)
RBC: 4.47 x10E6/uL (ref 3.77–5.28)
RDW: 14.1 % (ref 12.3–15.4)
WBC: 3.1 10*3/uL — AB (ref 3.4–10.8)

## 2013-10-20 ENCOUNTER — Other Ambulatory Visit: Payer: Medicare HMO

## 2013-10-23 ENCOUNTER — Encounter (HOSPITAL_BASED_OUTPATIENT_CLINIC_OR_DEPARTMENT_OTHER): Payer: Medicare HMO | Attending: Plastic Surgery

## 2013-10-23 DIAGNOSIS — I872 Venous insufficiency (chronic) (peripheral): Secondary | ICD-10-CM | POA: Insufficient documentation

## 2013-10-23 DIAGNOSIS — L97809 Non-pressure chronic ulcer of other part of unspecified lower leg with unspecified severity: Secondary | ICD-10-CM | POA: Insufficient documentation

## 2013-10-23 NOTE — Progress Notes (Signed)
[facility]  NAME:  Tiffany Barnes, Tiffany Barnes                ACCOUNT NO.:  [id number]  MEDICAL RECORD NO.:  [id number]      DATE OF BIRTH:  [birth year]  PHYSICIAN:  Wayland Denis, DO       VISIT DATE:  10/23/2013                                  OFFICE VISIT   The patient is here for a followup on her right leg chronic venous insufficiency ulcers.  She has undergone surgery in the past and ACell placement and then most recently Profore and collagen.  Today, she looks better than she has at all in the past.  Unfortunately, she used a different dressing over this past week, which caused some periwound irritation.  There is no change in her medications or social history. Review of systems is otherwise negative.  On exam, she is alert, oriented, and cooperative.  Her breathing is unlabored.  The wound is remarkably smaller.  It is filled in and epithelializing.  She has some periwound irritation. We will use collagen and Profore and see her back in 1 week.     Wayland Denis, DO     CS/MEDQ  D:  10/23/2013  T:  10/23/2013  Job:  [id number]

## 2013-10-30 ENCOUNTER — Telehealth: Payer: Self-pay | Admitting: Diagnostic Neuroimaging

## 2013-10-31 ENCOUNTER — Other Ambulatory Visit: Payer: Self-pay | Admitting: Diagnostic Neuroimaging

## 2013-10-31 MED ORDER — FINGOLIMOD HCL 0.5 MG PO CAPS: 0.5000 mg | ORAL_CAPSULE | Freq: Every day | ORAL | Status: DC

## 2013-10-31 NOTE — Telephone Encounter (Signed)
Patient returning call to check status on her Gilenya refill, please call and advise.

## 2013-10-31 NOTE — Telephone Encounter (Signed)
Called pt to inform her that her Rx has already been sent to her pharmacy. Pt verbalized understanding.

## 2013-10-31 NOTE — Telephone Encounter (Signed)
[facility]

## 2013-10-31 NOTE — Progress Notes (Signed)
[facility]  NAME:  Tiffany Barnes, Tiffany Barnes                ACCOUNT NO.:  [id number]  MEDICAL RECORD NO.:  [id number]      DATE OF BIRTH:  03/19/1956  PHYSICIAN:  Wayland Denis, DO       VISIT DATE:  10/30/2013                                  OFFICE VISIT   The patient is here for followup on her right leg chronic venous insufficiency ulcer.  Overall, she is doing extremely well.  She has undergone debridement, ACell, Profore, collagen placement in the past. She has severe sensitivities to the periwound area but overall she is doing much better.  There is a little bit of redness but not as bad as usual.  She is alert, oriented, cooperative, not in any distress.  Her breathing is normal.  The recommendation is to continue with the collagen, and we will see her back in followup.     Wayland Denis, DO     CS/MEDQ  D:  10/30/2013  T:  10/31/2013  Job:  [id number]

## 2013-11-01 MED ORDER — FINGOLIMOD HCL 0.5 MG PO CAPS: 0.5000 mg | ORAL_CAPSULE | Freq: Every day | ORAL | Status: DC

## 2013-11-01 NOTE — Telephone Encounter (Signed)
Called pt to inform her that her Rx was faxed to [facility] and if she has any other problems, questions or concerns to call the office. Pt verbalized understanding.

## 2013-11-01 NOTE — Telephone Encounter (Signed)
Lauren with [employer] needs urgent Rx for Fingolimod HCl (GILENYA) 0.5 MG CAPS.  Her contact # [phone] and fax # [phone].

## 2013-11-01 NOTE — Telephone Encounter (Signed)
Patient calling again to state that [facility] be [facility] an order for Gilenya, please return call to patient and advise.

## 2013-11-01 NOTE — Addendum Note (Signed)
Addended by: Demetrios LollBARNARD, CATHY C on: 11/01/2013 12:16 PM   Modules accepted: Orders

## 2013-11-06 ENCOUNTER — Telehealth: Payer: Self-pay | Admitting: Diagnostic Neuroimaging

## 2013-11-06 NOTE — Telephone Encounter (Signed)
She can reach PAP for Gilenya at [phone].  I called the patient back.  Provided this info.  She will contact the program to see if she qualifies for assistance and will call us back if needed.

## 2013-11-06 NOTE — Telephone Encounter (Signed)
Patient requesting telephone number for Gilenya assistance program.   Insurance coverage with [facility] and needing assistance with medication.
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
## 2014-03-05 ENCOUNTER — Telehealth: Payer: Self-pay | Admitting: Diagnostic Neuroimaging

## 2014-03-05 MED ORDER — PAROXETINE HCL 40 MG PO TABS: 40.0000 mg | ORAL_TABLET | Freq: Every day | ORAL | Status: DC

## 2014-03-05 NOTE — Telephone Encounter (Signed)
Rx has been sent.  I called the patient back.  She is aware.  

## 2014-03-05 NOTE — Telephone Encounter (Signed)
Patient requesting a Rx refill PARoxetine (PAXIL) 40 MG tablet due to change of Insurance.  [place] [facility] [place].  Please call and advise.

## 2014-04-11 ENCOUNTER — Ambulatory Visit (INDEPENDENT_AMBULATORY_CARE_PROVIDER_SITE_OTHER): Payer: Medicare HMO | Admitting: Diagnostic Neuroimaging

## 2014-04-11 ENCOUNTER — Encounter: Payer: Self-pay | Admitting: Diagnostic Neuroimaging

## 2014-04-11 VITALS — BP 148/79 | HR 47 | Temp 97.4°F | Ht 65.5 in | Wt 172.0 lb

## 2014-04-11 DIAGNOSIS — G35 Multiple sclerosis: Secondary | ICD-10-CM
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
## 2014-04-11 NOTE — Patient Instructions (Signed)
I will check MRI and lab testing. 

## 2014-04-23 ENCOUNTER — Telehealth: Payer: Self-pay | Admitting: Diagnostic Neuroimaging

## 2014-04-24 ENCOUNTER — Other Ambulatory Visit (INDEPENDENT_AMBULATORY_CARE_PROVIDER_SITE_OTHER): Payer: Self-pay

## 2014-04-24 DIAGNOSIS — Z0289 Encounter for other administrative examinations: Secondary | ICD-10-CM

## 2014-04-25 LAB — CBC WITH DIFFERENTIAL
BASOS ABS: 0 10*3/uL (ref 0.0–0.2)
Basos: 0 %
Eos: 3 %
Eosinophils Absolute: 0.1 10*3/uL (ref 0.0–0.4)
HCT: 38.6 % (ref 34.0–46.6)
Hemoglobin: 12.5 g/dL (ref 11.1–15.9)
Immature Grans (Abs): 0 10*3/uL (ref 0.0–0.1)
Immature Granulocytes: 0 %
LYMPHS: 9 %
Lymphocytes Absolute: 0.4 10*3/uL — ABNORMAL LOW (ref 0.7–3.1)
MCH: 29 pg (ref 26.6–33.0)
MCHC: 32.4 g/dL (ref 31.5–35.7)
MCV: 90 fL (ref 79–97)
MONOS ABS: 0.6 10*3/uL (ref 0.1–0.9)
Monocytes: 13 %
NEUTROS PCT: 75 %
Neutrophils Absolute: 3.1 10*3/uL (ref 1.4–7.0)
PLATELETS: 248 10*3/uL (ref 150–379)
RBC: 4.31 x10E6/uL (ref 3.77–5.28)
RDW: 13.6 % (ref 12.3–15.4)
WBC: 4.2 10*3/uL (ref 3.4–10.8)

## 2014-04-25 LAB — HEPATIC FUNCTION PANEL
ALT: 17 IU/L (ref 0–32)
AST: 22 IU/L (ref 0–40)
Albumin: 4.1 g/dL (ref 3.5–5.5)
Alkaline Phosphatase: 133 IU/L — ABNORMAL HIGH (ref 39–117)
Bilirubin, Direct: 0.22 mg/dL (ref 0.00–0.40)
TOTAL PROTEIN: 6.3 g/dL (ref 6.0–8.5)
Total Bilirubin: 0.6 mg/dL (ref 0.0–1.2)

## 2014-05-04 ENCOUNTER — Telehealth: Payer: Self-pay | Admitting: Diagnostic Neuroimaging

## 2014-05-04 NOTE — Telephone Encounter (Signed)
Patient is calling to get lab results. Please call. It is ok to leave a message.

## 2014-05-07 NOTE — Telephone Encounter (Signed)
Lymphs low (appropriately) and Alk phos borderline. Ok for now, but should repeat in 3 months. -VRP

## 2014-05-08 NOTE — Telephone Encounter (Signed)
Spoke to patient. Gave results. Patient verbalized understanding. 

## 2014-06-12 ENCOUNTER — Telehealth: Payer: Self-pay | Admitting: Diagnostic Neuroimaging

## 2014-06-12 MED ORDER — FINGOLIMOD HCL 0.5 MG PO CAPS: 0.5000 mg | ORAL_CAPSULE | Freq: Every day | ORAL | Status: DC

## 2014-06-12 NOTE — Telephone Encounter (Signed)
I called back.  This line is for [facility].  Spoke with Marylene Land  She asked that we send prescription via E-Rx.  Rx has been sent.

## 2014-06-12 NOTE — Telephone Encounter (Signed)
[facility] is calling to see if patient needs to continue Rx Gileyna as her prescription has run out.  Please contact Pharmacy at [phone].  Thanks!

## 2014-07-12 ENCOUNTER — Other Ambulatory Visit: Payer: Self-pay | Admitting: *Deleted

## 2014-07-12 ENCOUNTER — Telehealth: Payer: Self-pay | Admitting: Diagnostic Neuroimaging

## 2014-07-12 DIAGNOSIS — G35 Multiple sclerosis: Secondary | ICD-10-CM

## 2014-07-12 NOTE — Telephone Encounter (Signed)
Spoke to the pt on the phone, stated that we would get her MRI scheduled before her follow-up appt since she has MS. She said she would call back after the MRI and reschedule.

## 2014-07-12 NOTE — Telephone Encounter (Signed)
Pt is calling wanting to know if she should come to her appt or not.  The MRI was not scheduled and she is not sure why.  Her appointment is for Tuesday 2/23. Please call and advise.

## 2014-07-13 ENCOUNTER — Telehealth: Payer: Self-pay

## 2014-07-17 ENCOUNTER — Ambulatory Visit: Payer: Medicare HMO | Admitting: Diagnostic Neuroimaging

## 2014-07-23 ENCOUNTER — Telehealth: Payer: Self-pay | Admitting: *Deleted

## 2014-07-23 NOTE — Telephone Encounter (Signed)
Patient is calling wanting to know what is going on with her MRI being scheduled. The patient called [facility] and they had a note statng do not schedule. The patient would like to know why. Please call patient and advise.

## 2014-07-27 ENCOUNTER — Ambulatory Visit
Admission: RE | Admit: 2014-07-27 | Discharge: 2014-07-27 | Disposition: A | Discharge status: Home / Self Care | Payer: Medicare HMO | Source: Ambulatory Visit | Attending: Diagnostic Neuroimaging | Admitting: Diagnostic Neuroimaging

## 2014-07-27 DIAGNOSIS — G35 Multiple sclerosis: Secondary | ICD-10-CM | POA: Diagnosis not present

## 2014-07-27 MED ORDER — GADOBENATE DIMEGLUMINE 529 MG/ML IV SOLN
15.0000 mL | Freq: Once | INTRAVENOUS | Status: AC | PRN
  Administered 2014-07-27: 15 mL via INTRAVENOUS

## 2014-08-07 ENCOUNTER — Telehealth: Payer: Self-pay | Admitting: *Deleted

## 2014-08-07 NOTE — Telephone Encounter (Signed)
Patient is wanting to MRI results. The test was done 07-27-14. Please call the patient and advise.

## 2014-08-10 NOTE — Telephone Encounter (Signed)
Patient calling again for MRI results.  Please call and advise.

## 2014-08-15 ENCOUNTER — Telehealth: Payer: Self-pay | Admitting: Diagnostic Neuroimaging

## 2014-08-15 NOTE — Telephone Encounter (Signed)
FYI - Patient stated [facility] will fax a letter requesting medical clearance for knee surgery scheduled in June.

## 2014-08-17 ENCOUNTER — Telehealth: Payer: Self-pay | Admitting: *Deleted

## 2014-08-17 NOTE — Telephone Encounter (Signed)
error 

## 2014-08-17 NOTE — Telephone Encounter (Signed)
Spoke to Dr. Marjory Lies about the pts MRI which showed nothing acute and no new foci related to MS. Called and informed the pt of this. She stated an understanding. She asked about getting a follow up appt so she could get clearance to have a knee surgery. I was able to make her an appt for 08/21/14. She stated a thanks

## 2014-08-21 ENCOUNTER — Encounter: Payer: Self-pay | Admitting: Diagnostic Neuroimaging

## 2014-08-21 ENCOUNTER — Ambulatory Visit (INDEPENDENT_AMBULATORY_CARE_PROVIDER_SITE_OTHER): Payer: Medicare HMO | Admitting: Diagnostic Neuroimaging

## 2014-08-21 VITALS — BP 149/70 | HR 46 | Temp 97.6°F | Resp 14 | Ht 65.5 in | Wt 183.0 lb

## 2014-08-21 DIAGNOSIS — G35 Multiple sclerosis: Secondary | ICD-10-CM
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
## 2014-08-21 NOTE — Patient Instructions (Signed)
Continue gilenya.   Use walker at all times.

## 2014-08-22 LAB — COMPREHENSIVE METABOLIC PANEL
ALBUMIN: 4 g/dL (ref 3.5–5.5)
ALT: 15 IU/L (ref 0–32)
AST: 18 IU/L (ref 0–40)
Albumin/Globulin Ratio: 1.9 (ref 1.1–2.5)
Alkaline Phosphatase: 126 IU/L — ABNORMAL HIGH (ref 39–117)
BUN / CREAT RATIO: 36 — AB (ref 9–23)
BUN: 21 mg/dL (ref 6–24)
Bilirubin Total: 0.7 mg/dL (ref 0.0–1.2)
CALCIUM: 9.1 mg/dL (ref 8.7–10.2)
CHLORIDE: 101 mmol/L (ref 97–108)
CO2: 27 mmol/L (ref 18–29)
Creatinine, Ser: 0.59 mg/dL (ref 0.57–1.00)
GFR calc non Af Amer: 101 mL/min/{1.73_m2} (ref >59)
GFR, EST AFRICAN AMERICAN: 116 mL/min/{1.73_m2} (ref >59)
Globulin, Total: 2.1 g/dL (ref 1.5–4.5)
Glucose: 82 mg/dL (ref 65–99)
Potassium: 4.3 mmol/L (ref 3.5–5.2)
SODIUM: 141 mmol/L (ref 134–144)
TOTAL PROTEIN: 6.1 g/dL (ref 6.0–8.5)

## 2014-08-22 LAB — CBC WITH DIFFERENTIAL/PLATELET
BASOS: 0 %
Basophils Absolute: 0 10*3/uL (ref 0.0–0.2)
EOS ABS: 0.1 10*3/uL (ref 0.0–0.4)
EOS: 4 %
HCT: 38.1 % (ref 34.0–46.6)
Hemoglobin: 11.9 g/dL (ref 11.1–15.9)
IMMATURE GRANS (ABS): 0 10*3/uL (ref 0.0–0.1)
Immature Granulocytes: 0 %
LYMPHS ABS: 0.1 10*3/uL — AB (ref 0.7–3.1)
Lymphs: 4 %
MCH: 27.9 pg (ref 26.6–33.0)
MCHC: 31.2 g/dL — ABNORMAL LOW (ref 31.5–35.7)
MCV: 89 fL (ref 79–97)
MONOS ABS: 0.6 10*3/uL (ref 0.1–0.9)
Monocytes: 18 %
NEUTROS ABS: 2.6 10*3/uL (ref 1.4–7.0)
NEUTROS PCT: 74 %
PLATELETS: 229 10*3/uL (ref 150–379)
RBC: 4.27 x10E6/uL (ref 3.77–5.28)
RDW: 14.3 % (ref 12.3–15.4)
WBC: 3.5 10*3/uL (ref 3.4–10.8)

## 2014-08-22 NOTE — Addendum Note (Signed)
Addended byJoycelyn Schmid on: 08/22/2014 06:55 PM   Modules accepted: Orders

## 2014-08-23 ENCOUNTER — Telehealth: Payer: Self-pay

## 2014-08-23 NOTE — Telephone Encounter (Signed)
-----   Message from Suanne Marker, MD sent at 08/22/2014  6:55 PM EDT ----- Low abs lymphs, so will need to repeat counts in 1 month. -VRP

## 2014-08-23 NOTE — Telephone Encounter (Signed)
Spoke to patient. Gave lab results. Patient verbalized understanding.  

## 2014-09-19 ENCOUNTER — Other Ambulatory Visit (INDEPENDENT_AMBULATORY_CARE_PROVIDER_SITE_OTHER): Payer: Self-pay

## 2014-09-19 DIAGNOSIS — Z0289 Encounter for other administrative examinations: Secondary | ICD-10-CM

## 2014-09-19 DIAGNOSIS — G35 Multiple sclerosis: Secondary | ICD-10-CM

## 2014-09-20 LAB — CREATININE, SERUM
Creatinine, Ser: 0.57 mg/dL (ref 0.57–1.00)
GFR, EST AFRICAN AMERICAN: 117 mL/min/{1.73_m2} (ref >59)
GFR, EST NON AFRICAN AMERICAN: 102 mL/min/{1.73_m2} (ref >59)

## 2014-09-20 LAB — CBC WITH DIFFERENTIAL/PLATELET
BASOS ABS: 0 10*3/uL (ref 0.0–0.2)
Basos: 0 %
EOS (ABSOLUTE): 0.1 10*3/uL (ref 0.0–0.4)
Eos: 3 %
Hematocrit: 38.3 % (ref 34.0–46.6)
Hemoglobin: 12.6 g/dL (ref 11.1–15.9)
IMMATURE GRANS (ABS): 0 10*3/uL (ref 0.0–0.1)
IMMATURE GRANULOCYTES: 0 %
LYMPHS: 11 %
Lymphocytes Absolute: 0.4 10*3/uL — ABNORMAL LOW (ref 0.7–3.1)
MCH: 28.2 pg (ref 26.6–33.0)
MCHC: 32.9 g/dL (ref 31.5–35.7)
MCV: 86 fL (ref 79–97)
MONOCYTES: 10 %
Monocytes Absolute: 0.4 10*3/uL (ref 0.1–0.9)
NEUTROS PCT: 76 %
Neutrophils Absolute: 2.8 10*3/uL (ref 1.4–7.0)
PLATELETS: 232 10*3/uL (ref 150–379)
RBC: 4.47 x10E6/uL (ref 3.77–5.28)
RDW: 14.2 % (ref 12.3–15.4)
WBC: 3.7 10*3/uL (ref 3.4–10.8)

## 2014-09-20 LAB — BUN: BUN: 20 mg/dL (ref 6–24)

## 2014-10-13 ENCOUNTER — Other Ambulatory Visit: Payer: Self-pay | Admitting: Diagnostic Neuroimaging

## 2014-10-23 ENCOUNTER — Ambulatory Visit (HOSPITAL_COMMUNITY)
Admission: RE | Admit: 2014-10-23 | Discharge: 2014-10-23 | Disposition: A | Discharge status: Home / Self Care | Payer: Medicare HMO | Source: Ambulatory Visit | Attending: General Surgery | Admitting: General Surgery

## 2014-10-23 ENCOUNTER — Encounter (HOSPITAL_BASED_OUTPATIENT_CLINIC_OR_DEPARTMENT_OTHER): Payer: Medicare HMO | Attending: General Surgery

## 2014-10-23 ENCOUNTER — Other Ambulatory Visit (HOSPITAL_BASED_OUTPATIENT_CLINIC_OR_DEPARTMENT_OTHER): Payer: Self-pay | Admitting: General Surgery

## 2014-10-23 DIAGNOSIS — I87311 Chronic venous hypertension (idiopathic) with ulcer of right lower extremity: Secondary | ICD-10-CM | POA: Diagnosis not present

## 2014-10-23 DIAGNOSIS — M869 Osteomyelitis, unspecified: Secondary | ICD-10-CM

## 2014-10-23 DIAGNOSIS — L97911 Non-pressure chronic ulcer of unspecified part of right lower leg limited to breakdown of skin: Secondary | ICD-10-CM | POA: Diagnosis not present

## 2014-10-23 DIAGNOSIS — G35 Multiple sclerosis: Secondary | ICD-10-CM | POA: Insufficient documentation

## 2014-10-23 DIAGNOSIS — M19071 Primary osteoarthritis, right ankle and foot: Secondary | ICD-10-CM | POA: Diagnosis present

## 2014-10-23 DIAGNOSIS — I10 Essential (primary) hypertension: Secondary | ICD-10-CM | POA: Diagnosis not present

## 2014-10-29 ENCOUNTER — Other Ambulatory Visit (HOSPITAL_COMMUNITY): Payer: Medicare HMO

## 2014-10-30 ENCOUNTER — Encounter (HOSPITAL_BASED_OUTPATIENT_CLINIC_OR_DEPARTMENT_OTHER): Payer: Medicare HMO | Attending: General Surgery

## 2014-10-30 DIAGNOSIS — L97811 Non-pressure chronic ulcer of other part of right lower leg limited to breakdown of skin: Secondary | ICD-10-CM | POA: Diagnosis not present

## 2014-10-30 DIAGNOSIS — G35 Multiple sclerosis: Secondary | ICD-10-CM | POA: Diagnosis not present

## 2014-10-30 DIAGNOSIS — I87311 Chronic venous hypertension (idiopathic) with ulcer of right lower extremity: Secondary | ICD-10-CM | POA: Insufficient documentation

## 2014-10-30 DIAGNOSIS — I872 Venous insufficiency (chronic) (peripheral): Secondary | ICD-10-CM | POA: Insufficient documentation

## 2014-11-06 DIAGNOSIS — I872 Venous insufficiency (chronic) (peripheral): Secondary | ICD-10-CM | POA: Diagnosis not present

## 2014-11-06 DIAGNOSIS — G35 Multiple sclerosis: Secondary | ICD-10-CM | POA: Diagnosis not present

## 2014-11-06 DIAGNOSIS — L97811 Non-pressure chronic ulcer of other part of right lower leg limited to breakdown of skin: Secondary | ICD-10-CM | POA: Diagnosis not present

## 2014-11-06 DIAGNOSIS — I87311 Chronic venous hypertension (idiopathic) with ulcer of right lower extremity: Secondary | ICD-10-CM | POA: Diagnosis not present

## 2014-11-09 ENCOUNTER — Encounter (HOSPITAL_COMMUNITY): Admission: RE | Payer: Self-pay | Source: Ambulatory Visit

## 2014-11-09 ENCOUNTER — Inpatient Hospital Stay (HOSPITAL_COMMUNITY): Admission: RE | Admit: 2014-11-09 | Payer: Medicare HMO | Source: Ambulatory Visit | Admitting: Orthopedic Surgery

## 2014-11-09 SURGERY — ARTHROPLASTY, KNEE, TOTAL
Anesthesia: General | Site: Knee | Laterality: Left

## 2014-11-13 DIAGNOSIS — L97811 Non-pressure chronic ulcer of other part of right lower leg limited to breakdown of skin: Secondary | ICD-10-CM | POA: Diagnosis not present

## 2014-11-13 DIAGNOSIS — I87311 Chronic venous hypertension (idiopathic) with ulcer of right lower extremity: Secondary | ICD-10-CM | POA: Diagnosis not present

## 2014-11-13 DIAGNOSIS — G35 Multiple sclerosis: Secondary | ICD-10-CM | POA: Diagnosis not present

## 2014-11-13 DIAGNOSIS — I872 Venous insufficiency (chronic) (peripheral): Secondary | ICD-10-CM | POA: Diagnosis not present

## 2014-11-20 DIAGNOSIS — L97811 Non-pressure chronic ulcer of other part of right lower leg limited to breakdown of skin: Secondary | ICD-10-CM | POA: Diagnosis not present

## 2014-11-20 DIAGNOSIS — I872 Venous insufficiency (chronic) (peripheral): Secondary | ICD-10-CM | POA: Diagnosis not present

## 2014-11-20 DIAGNOSIS — G35 Multiple sclerosis: Secondary | ICD-10-CM | POA: Diagnosis not present

## 2014-11-20 DIAGNOSIS — I87311 Chronic venous hypertension (idiopathic) with ulcer of right lower extremity: Secondary | ICD-10-CM | POA: Diagnosis not present

## 2014-11-27 ENCOUNTER — Encounter (HOSPITAL_BASED_OUTPATIENT_CLINIC_OR_DEPARTMENT_OTHER): Payer: Medicare HMO | Attending: General Surgery

## 2014-11-27 DIAGNOSIS — F418 Other specified anxiety disorders: Secondary | ICD-10-CM | POA: Diagnosis not present

## 2014-11-27 DIAGNOSIS — I87311 Chronic venous hypertension (idiopathic) with ulcer of right lower extremity: Secondary | ICD-10-CM | POA: Diagnosis present

## 2014-11-27 DIAGNOSIS — M1990 Unspecified osteoarthritis, unspecified site: Secondary | ICD-10-CM | POA: Insufficient documentation

## 2014-11-27 DIAGNOSIS — I10 Essential (primary) hypertension: Secondary | ICD-10-CM | POA: Insufficient documentation

## 2014-12-04 DIAGNOSIS — I87311 Chronic venous hypertension (idiopathic) with ulcer of right lower extremity: Secondary | ICD-10-CM | POA: Diagnosis not present

## 2014-12-04 DIAGNOSIS — I10 Essential (primary) hypertension: Secondary | ICD-10-CM | POA: Diagnosis not present

## 2014-12-04 DIAGNOSIS — F418 Other specified anxiety disorders: Secondary | ICD-10-CM | POA: Diagnosis not present

## 2014-12-04 DIAGNOSIS — M1990 Unspecified osteoarthritis, unspecified site: Secondary | ICD-10-CM | POA: Diagnosis not present

## 2014-12-11 DIAGNOSIS — M1990 Unspecified osteoarthritis, unspecified site: Secondary | ICD-10-CM | POA: Diagnosis not present

## 2014-12-11 DIAGNOSIS — I10 Essential (primary) hypertension: Secondary | ICD-10-CM | POA: Diagnosis not present

## 2014-12-11 DIAGNOSIS — F418 Other specified anxiety disorders: Secondary | ICD-10-CM | POA: Diagnosis not present

## 2014-12-11 DIAGNOSIS — I87311 Chronic venous hypertension (idiopathic) with ulcer of right lower extremity: Secondary | ICD-10-CM | POA: Diagnosis not present

## 2014-12-18 DIAGNOSIS — M1990 Unspecified osteoarthritis, unspecified site: Secondary | ICD-10-CM | POA: Diagnosis not present

## 2014-12-18 DIAGNOSIS — F418 Other specified anxiety disorders: Secondary | ICD-10-CM | POA: Diagnosis not present

## 2014-12-18 DIAGNOSIS — I87311 Chronic venous hypertension (idiopathic) with ulcer of right lower extremity: Secondary | ICD-10-CM | POA: Diagnosis not present

## 2014-12-18 DIAGNOSIS — I10 Essential (primary) hypertension: Secondary | ICD-10-CM | POA: Diagnosis not present

## 2014-12-25 ENCOUNTER — Encounter (HOSPITAL_BASED_OUTPATIENT_CLINIC_OR_DEPARTMENT_OTHER): Payer: Medicare HMO | Attending: General Surgery

## 2014-12-25 DIAGNOSIS — I87311 Chronic venous hypertension (idiopathic) with ulcer of right lower extremity: Secondary | ICD-10-CM | POA: Diagnosis not present

## 2014-12-25 DIAGNOSIS — L97211 Non-pressure chronic ulcer of right calf limited to breakdown of skin: Secondary | ICD-10-CM | POA: Insufficient documentation

## 2014-12-25 DIAGNOSIS — M1990 Unspecified osteoarthritis, unspecified site: Secondary | ICD-10-CM | POA: Diagnosis not present

## 2014-12-25 DIAGNOSIS — I10 Essential (primary) hypertension: Secondary | ICD-10-CM | POA: Insufficient documentation

## 2014-12-25 DIAGNOSIS — F419 Anxiety disorder, unspecified: Secondary | ICD-10-CM | POA: Diagnosis not present

## 2015-01-01 DIAGNOSIS — M1990 Unspecified osteoarthritis, unspecified site: Secondary | ICD-10-CM | POA: Diagnosis not present

## 2015-01-01 DIAGNOSIS — I10 Essential (primary) hypertension: Secondary | ICD-10-CM | POA: Diagnosis not present

## 2015-01-01 DIAGNOSIS — I87311 Chronic venous hypertension (idiopathic) with ulcer of right lower extremity: Secondary | ICD-10-CM | POA: Diagnosis not present

## 2015-01-01 DIAGNOSIS — L97211 Non-pressure chronic ulcer of right calf limited to breakdown of skin: Secondary | ICD-10-CM | POA: Diagnosis not present

## 2015-01-29 ENCOUNTER — Encounter: Payer: Self-pay | Admitting: *Deleted

## 2015-01-29 ENCOUNTER — Telehealth: Payer: Self-pay | Admitting: Diagnostic Neuroimaging

## 2015-01-29 NOTE — Telephone Encounter (Signed)
Patient is calling as she needs a written excuse from the doctor stating that she cannot serve on jury duty.  Please call.

## 2015-01-29 NOTE — Telephone Encounter (Addendum)
Spoke with patient re: letter to release her from jury duty. She states her knee surgery has been post poned due to her 60 yr old grand daughter, who lives with her, being diagnosed with cancer. She and her husband have to transport grand daughter to [facility] once a week for 12 weeks for chemotherapy. She also states that she is using a walker at home but whenever she goes out, she has to use a wheelchair. She is having chronic pain in both legs. Informed Tiffany Barnes this RN will have letter mailed to her no later than tomorrow; she needs it before 02/18/15. Confirmed her mailing address. She verbalized understanding, appreciation.  12:00 pm The completed and signed letter sent to MR to be mailed.

## 2015-02-20 ENCOUNTER — Ambulatory Visit (INDEPENDENT_AMBULATORY_CARE_PROVIDER_SITE_OTHER): Payer: Medicare HMO | Admitting: Diagnostic Neuroimaging

## 2015-02-20 ENCOUNTER — Encounter: Payer: Self-pay | Admitting: Diagnostic Neuroimaging

## 2015-02-20 VITALS — BP 145/72 | HR 53 | Ht 65.5 in | Wt 169.0 lb

## 2015-02-20 DIAGNOSIS — G35 Multiple sclerosis: Secondary | ICD-10-CM | POA: Diagnosis not present

## 2015-02-20 DIAGNOSIS — M25562 Pain in left knee: Secondary | ICD-10-CM

## 2015-02-20 DIAGNOSIS — M25561 Pain in right knee: Secondary | ICD-10-CM
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
## 2015-02-20 NOTE — Patient Instructions (Addendum)
Thank you for coming to see [place] at [facility]. I hope we have been able to provide you high quality care today.  You may receive a patient satisfaction survey over the next few weeks. We would appreciate your feedback and comments so that we may continue to improve ourselves and the health of our patients.  - continue gilenya and vitamin D supplement - check labs today - physical therapy evaluation    ~~~~~~~~~~~~~~~~~~~~~~~~~~~~~~~~~~~~~~~~~~~~~~~~~~~~~~~~~~~~~~~~~  DR. PENUMALLI'S GUIDE TO HAPPY AND HEALTHY LIVING These are some of my general health and wellness recommendations. Some of them may apply to you better than others. Please use common sense as you try these suggestions and feel free to ask me any questions.   ACTIVITY/FITNESS Mental, social, emotional and physical stimulation are very important for brain and body health. Try learning a new activity (arts, music, language, sports, games).  Keep moving your body to the best of your abilities. You can do this at home, inside or outside, the park, community center, gym or anywhere you like. Consider a physical therapist or personal trainer to get started. Consider the app Sworkit. Fitness trackers such as smart-watches, smart-phones or Fitbits can help as well.   NUTRITION Eat more plants: colorful vegetables, nuts, seeds and berries.  Eat less sugar, salt, preservatives and processed foods.  Avoid toxins such as cigarettes and alcohol.  Drink water when you are thirsty. Warm water with a slice of lemon is an excellent morning drink to start the day.  Consider these websites for more information The Nutrition Source (https://www.henry-hernandez.biz/) Precision Nutrition (WindowBlog.ch)   RELAXATION Consider practicing mindfulness meditation or other relaxation techniques such as deep breathing, prayer, yoga, tai chi, massage. See website mindful.org or the  apps Headspace or Calm to help get started.   SLEEP Try to get at least 7-8+ hours sleep per day. Regular exercise and reduced caffeine will help you sleep better. Practice good sleep hygeine techniques. See website sleep.org for more information.   PLANNING Prepare estate planning, living will, healthcare POA documents. Sometimes this is best planned with the help of an attorney. Theconversationproject.org and agingwithdignity.org are excellent resources.

## 2015-02-21 ENCOUNTER — Telehealth: Payer: Self-pay | Admitting: *Deleted

## 2015-02-21 LAB — CBC WITH DIFFERENTIAL/PLATELET
BASOS: 0 %
Basophils Absolute: 0 10*3/uL (ref 0.0–0.2)
EOS (ABSOLUTE): 0.1 10*3/uL (ref 0.0–0.4)
Eos: 2 %
Hematocrit: 40.4 % (ref 34.0–46.6)
Hemoglobin: 13.2 g/dL (ref 11.1–15.9)
IMMATURE GRANS (ABS): 0 10*3/uL (ref 0.0–0.1)
Immature Granulocytes: 0 %
LYMPHS ABS: 0.5 10*3/uL — AB (ref 0.7–3.1)
Lymphs: 15 %
MCH: 28.8 pg (ref 26.6–33.0)
MCHC: 32.7 g/dL (ref 31.5–35.7)
MCV: 88 fL (ref 79–97)
Monocytes Absolute: 0.3 10*3/uL (ref 0.1–0.9)
Monocytes: 10 %
NEUTROS ABS: 2.2 10*3/uL (ref 1.4–7.0)
Neutrophils: 73 %
PLATELETS: 235 10*3/uL (ref 150–379)
RBC: 4.59 x10E6/uL (ref 3.77–5.28)
RDW: 13.9 % (ref 12.3–15.4)
WBC: 3.1 10*3/uL — ABNORMAL LOW (ref 3.4–10.8)

## 2015-02-21 LAB — COMPREHENSIVE METABOLIC PANEL
A/G RATIO: 2.1 (ref 1.1–2.5)
ALT: 9 IU/L (ref 0–32)
AST: 18 IU/L (ref 0–40)
Albumin: 4.1 g/dL (ref 3.6–4.8)
Alkaline Phosphatase: 109 IU/L (ref 39–117)
BILIRUBIN TOTAL: 0.6 mg/dL (ref 0.0–1.2)
BUN/Creatinine Ratio: 30 — ABNORMAL HIGH (ref 11–26)
BUN: 17 mg/dL (ref 8–27)
CO2: 26 mmol/L (ref 18–29)
Calcium: 8.5 mg/dL — ABNORMAL LOW (ref 8.7–10.3)
Chloride: 103 mmol/L (ref 97–108)
Creatinine, Ser: 0.57 mg/dL (ref 0.57–1.00)
GFR calc Af Amer: 117 mL/min/{1.73_m2} (ref >59)
GFR, EST NON AFRICAN AMERICAN: 101 mL/min/{1.73_m2} (ref >59)
GLOBULIN, TOTAL: 2 g/dL (ref 1.5–4.5)
Glucose: 85 mg/dL (ref 65–99)
POTASSIUM: 4.2 mmol/L (ref 3.5–5.2)
SODIUM: 143 mmol/L (ref 134–144)
Total Protein: 6.1 g/dL (ref 6.0–8.5)

## 2015-02-21 LAB — VITAMIN D 25 HYDROXY (VIT D DEFICIENCY, FRACTURES): Vit D, 25-Hydroxy: 23 ng/mL — ABNORMAL LOW (ref 30.0–100.0)

## 2015-02-21 NOTE — Telephone Encounter (Signed)
Spoke with patient and informed her, per Dr Marjory Lies that her lymphocytes are low which is to be expected with Gilenya. Also informed her that her Vit D level is still low, and Dr Marjory Lies wants her to increase her Vit D to 2000 IU a day. She verbalized understanding.

## 2015-02-21 NOTE — Telephone Encounter (Signed)
-----   Message from Suanne Marker, MD sent at 02/21/2015 11:51 AM EDT ----- pls call with labs. Lymphocytes are low, but stable (this is expected with gilenya). Vit D still low; can increase vitamin D supplement to 2000 IU per day. Other labs ok. -VRP

## 2015-03-07 ENCOUNTER — Ambulatory Visit: Payer: Medicare HMO | Attending: Diagnostic Neuroimaging | Admitting: Physical Therapy

## 2015-03-07 DIAGNOSIS — M25661 Stiffness of right knee, not elsewhere classified: Secondary | ICD-10-CM | POA: Insufficient documentation

## 2015-03-07 DIAGNOSIS — M25561 Pain in right knee: Secondary | ICD-10-CM | POA: Diagnosis present

## 2015-03-07 DIAGNOSIS — R269 Unspecified abnormalities of gait and mobility: Secondary | ICD-10-CM | POA: Diagnosis present

## 2015-03-07 DIAGNOSIS — M25562 Pain in left knee: Secondary | ICD-10-CM | POA: Insufficient documentation

## 2015-03-07 DIAGNOSIS — R531 Weakness: Secondary | ICD-10-CM | POA: Diagnosis present

## 2015-03-07 DIAGNOSIS — M25662 Stiffness of left knee, not elsewhere classified: Secondary | ICD-10-CM | POA: Diagnosis present

## 2015-03-07 NOTE — Patient Instructions (Signed)
  Heel Slide   Bend left knee and pull heel toward buttocks. Use sheet, strap, or rope to actively assist knee further into flexion.  Repeat _10-20___ times. Do __2__ sessions per day.  Copyright  VHI. All rights reserved.    Strengthening: Straight Leg Raise (Phase 1)   Tighten muscles on front of right thigh, then lift leg _10___ inches from surface, keeping knee locked. Slowly lower leg down without bending knee. Repeat _10___ times per set. Do _1-3___ sets per session. Do _3-4___ sessions per day.   Bridge   Lie back, legs bent. Inhale, pressing hips up. Keeping ribs in, lengthen lower back. Exhale, rolling down along spine from top.  Repeat _10-30___ times. Do __2__ sessions per day.   Solon Palm, PT 03/07/2015 10:30 AM [facility] [address] [place], [place], [address] Phone: [phone]   Fax:  [phone]
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
## 2015-03-13 ENCOUNTER — Ambulatory Visit: Payer: Medicare HMO | Admitting: Physical Therapy

## 2015-03-13 ENCOUNTER — Encounter: Payer: Self-pay | Admitting: Physical Therapy

## 2015-03-13 DIAGNOSIS — R531 Weakness: Secondary | ICD-10-CM

## 2015-03-13 DIAGNOSIS — R269 Unspecified abnormalities of gait and mobility: Secondary | ICD-10-CM

## 2015-03-13 DIAGNOSIS — M25561 Pain in right knee: Secondary | ICD-10-CM

## 2015-03-13 DIAGNOSIS — M25562 Pain in left knee: Secondary | ICD-10-CM

## 2015-03-13 DIAGNOSIS — M25662 Stiffness of left knee, not elsewhere classified: Secondary | ICD-10-CM

## 2015-03-13 DIAGNOSIS — M25661 Stiffness of right knee, not elsewhere classified: Secondary | ICD-10-CM
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
## 2015-05-03 ENCOUNTER — Telehealth: Payer: Self-pay | Admitting: Diagnostic Neuroimaging

## 2015-05-03 ENCOUNTER — Telehealth: Payer: Self-pay | Admitting: *Deleted

## 2015-05-03 NOTE — Telephone Encounter (Signed)
Form,[employer] received from [place] to Melissa C and Dr Marjory Lies 05/03/15.

## 2015-05-03 NOTE — Telephone Encounter (Signed)
Patient called back after her husband dropped off disability paperwork to be filled out and paid the fee.  She states that she was not told by phone staff she needed to fill out a release for information sheet.  She will be faxing the release back to Medical records.

## 2015-05-06 DIAGNOSIS — Z0289 Encounter for other administrative examinations: Secondary | ICD-10-CM

## 2015-05-09 NOTE — Telephone Encounter (Addendum)
On Dr [employer] desk for review, signature.  05/09/15 2:21 pm  Papers completed, sent to MR for processing, scanning.

## 2015-05-13 ENCOUNTER — Telehealth: Payer: Self-pay | Admitting: *Deleted

## 2015-05-13 NOTE — Telephone Encounter (Signed)
Form,[employer]igna  received,completed by Dr Marjory Lies and Pincus Sanes faxed 05/09/15.

## 2015-05-30 ENCOUNTER — Other Ambulatory Visit: Payer: Self-pay | Admitting: Diagnostic Neuroimaging

## 2015-06-17 ENCOUNTER — Telehealth: Payer: Self-pay | Admitting: Diagnostic Neuroimaging

## 2015-06-17 MED ORDER — FINGOLIMOD HCL 0.5 MG PO CAPS: 0.5000 mg | ORAL_CAPSULE | Freq: Every day | ORAL | Status: DC

## 2015-06-17 NOTE — Telephone Encounter (Signed)
Rx has been sent to [facility] per patient request.  I called back and spoke with the patient.  She will contact [facility] again and see if they can provide her with a temp supply of meds while funding is pending if she is not able to afford it through [facility].  She will call us back if anything further is needed.

## 2015-06-17 NOTE — Telephone Encounter (Signed)
Pt called said she was told the PAP has ran funds. She was told to call provider to have RX faxed to [facility] F# [phone]. She does not understand what is going on. She can not afford medication without being in the program. She has 3 days left. She has contacted [facility] and she was then referred to [facility] and was told they did not have funds. She also called Wellness Assistance Program and they did not have any funds either.

## 2015-06-27 NOTE — Telephone Encounter (Signed)
I called [facility] at the number provided.  Spoke with Nance Pear, who was unable to assist, and transferred me to pharmacist [place].  I gave verbal Berkley Harvey for them to dispense complimentary doses of Gilenya for this patient while alternate funding for assistance is pending.  They will dispense 14 day supply with up to 3 refills as needed.  They will contact the patient to schedule delivery, and call us back if anything further is needed.

## 2015-06-27 NOTE — Telephone Encounter (Signed)
Patient called to advise, Tiffany Barnes is going to help with assistance for [facility], they need RX over the phone, please call [phone].

## 2015-07-16 ENCOUNTER — Encounter: Payer: Self-pay | Admitting: *Deleted

## 2015-07-22 ENCOUNTER — Telehealth: Payer: Self-pay | Admitting: *Deleted

## 2015-07-22 NOTE — Telephone Encounter (Signed)
Spoke with patient re: [employer] pt assistance program papers this RN received today. She stated that she called them for assistance. She is expecting papers herself. Informed her would complete physician papers; she will bring in her completed papers. She requested this RN fax to [employer] when completed. Informed her this RN will send fax. She verbalized understanding, appreciation.

## 2015-08-05 NOTE — Telephone Encounter (Signed)
Error

## 2015-08-06 DIAGNOSIS — M1712 Unilateral primary osteoarthritis, left knee: Secondary | ICD-10-CM | POA: Insufficient documentation

## 2015-08-06 DIAGNOSIS — I10 Essential (primary) hypertension: Secondary | ICD-10-CM | POA: Insufficient documentation

## 2015-08-14 ENCOUNTER — Telehealth: Payer: Self-pay | Admitting: Diagnostic Neuroimaging

## 2015-08-14 NOTE — Telephone Encounter (Signed)
Tiffany Barnes . Placed patient assistance form on your next .  1. Please fill out highlighted medication # 1 . Please attach RX prescription.  2. Dr. Marjory Lies will need to sign.  3. Back to [facility] for processing.

## 2015-08-15 NOTE — Telephone Encounter (Signed)
Called and spoke to [employer]  The only information they could tell me is that  Patient's enrollment needs to be resubmitted . [employer] is mailing her one sample a week as far as Gilenya . Patient has three left. Please attach RX.  Called patient and she relayed same information.

## 2015-08-15 NOTE — Telephone Encounter (Signed)
Signed prescription form given to [facility] for processing.

## 2015-08-16 ENCOUNTER — Other Ambulatory Visit: Payer: Self-pay | Admitting: Diagnostic Neuroimaging

## 2015-08-16 MED ORDER — FINGOLIMOD HCL 0.5 MG PO CAPS: 0.5000 mg | ORAL_CAPSULE | Freq: Every day | ORAL | Status: DC

## 2015-08-16 NOTE — Telephone Encounter (Signed)
Script and forms for [employer] on [employer] for processing.

## 2015-08-19 NOTE — Telephone Encounter (Signed)
Spoke with patient and informed her all her information including dr's prescription faxed to [employer] this morning. She verbalized understanding, appreciation.

## 2015-08-19 NOTE — Telephone Encounter (Signed)
Patient is returning [facility] call and also to advise [facility] needs Rx for Gilenya, only got form from our office, states "she paid $25 for all this stuff".

## 2015-08-21 ENCOUNTER — Ambulatory Visit: Payer: Medicare HMO | Admitting: Diagnostic Neuroimaging

## 2015-08-29 ENCOUNTER — Telehealth: Payer: Self-pay | Admitting: Diagnostic Neuroimaging

## 2015-08-29 NOTE — Telephone Encounter (Signed)
Patient has been approved  patient assistance program [employer] telephone [phone].  ([employer] ). I have called [employer]  go program at [phone]. Both Programs have all information they need. [employer] will be in touch with patient next week to set up at Shipment date.

## 2015-09-02 NOTE — Telephone Encounter (Signed)
[employer] called requesting a PA be done on [employer]. May call [phone]

## 2015-09-03 NOTE — Telephone Encounter (Signed)
Phone  number listed in 09/02/15 phone note is not for [employer]. Will attempt to reach [employer] re: PA for [employer] . Called [phone], LVM requesting call back if the number was to [employer].

## 2015-09-11 NOTE — Telephone Encounter (Signed)
No PA needed per Cover My Meds.

## 2015-10-08 ENCOUNTER — Telehealth: Payer: Self-pay | Admitting: Diagnostic Neuroimaging

## 2015-10-08 NOTE — Telephone Encounter (Signed)
Unable to reach [place], did not LVM.

## 2015-10-08 NOTE — Telephone Encounter (Signed)
VICKI/GILENYA NURSE  [phone]  PRIOR AUTHO NEEDS DONE ON THE PT

## 2015-10-09 NOTE — Telephone Encounter (Signed)
Attempted to check status of [employer] prescription. Unable to confirm at this time.

## 2015-10-10 NOTE — Telephone Encounter (Signed)
LVM for Tiffany Barnes informing her [employer] has PA through 10/09/2017. Left name, number for questions.

## 2015-11-04 ENCOUNTER — Telehealth: Payer: Self-pay | Admitting: Diagnostic Neuroimaging

## 2015-11-04 NOTE — Telephone Encounter (Signed)
Patient called, states she was told by [employer] Go that we need to submit new Rx for Gilenya, phone [phone] through patient assistance.

## 2015-11-05 NOTE — Telephone Encounter (Signed)
Spoke with patient who stated [employer] support  is supposed to be mailing her medication tomorrow, but she stated they asked for "something to do with my insurance". Informed her this RN will call today to find out what they need.

## 2015-11-05 NOTE — Telephone Encounter (Signed)
Received fax from [employer]: she is approved for NPAF assistance for remainder of calendar year, re: [facility].

## 2015-11-05 NOTE — Telephone Encounter (Addendum)
Spoke with Gaston Islam Go who stated [employer] does not have record of PA for this medication. Was recorded in chart on 10/10/15 that PA is good through 10/09/2017, but Ezequiel Essex is requesting PA number.  Spoke with Rickey [employer] insurance, [phone],  who provided Gil[employer] PA # [id number], GPN [phone].  Called Suzette Battiest, [employer] pt assistance, re:  Gilenya and gave above PA information.  She stated this will go into review for patient benefits, but based on insurance and PA information, she felt patient would receive assistance. This RN pod will receive fax when assistance status is decided.

## 2015-11-11 ENCOUNTER — Encounter: Payer: Self-pay | Admitting: Diagnostic Neuroimaging

## 2015-11-11 ENCOUNTER — Ambulatory Visit (INDEPENDENT_AMBULATORY_CARE_PROVIDER_SITE_OTHER): Payer: Medicare HMO | Admitting: Diagnostic Neuroimaging

## 2015-11-11 VITALS — BP 135/73 | HR 45 | Ht 65.5 in | Wt 182.2 lb

## 2015-11-11 DIAGNOSIS — R5383 Other fatigue: Secondary | ICD-10-CM

## 2015-11-11 DIAGNOSIS — R001 Bradycardia, unspecified: Secondary | ICD-10-CM

## 2015-11-11 DIAGNOSIS — G35 Multiple sclerosis: Secondary | ICD-10-CM
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
## 2015-11-11 NOTE — Patient Instructions (Signed)
Thank you for coming to see [place] at [facility]. I hope we have been able to provide you high quality care today.  You may receive a patient satisfaction survey over the next few weeks. We would appreciate your feedback and comments so that we may continue to improve ourselves and the health of our patients.  - I will check labs today - continue gilenya and vitamin D   ~~~~~~~~~~~~~~~~~~~~~~~~~~~~~~~~~~~~~~~~~~~~~~~~~~~~~~~~~~~~~~~~~  DR. PENUMALLI'S GUIDE TO HAPPY AND HEALTHY LIVING These are some of my general health and wellness recommendations. Some of them may apply to you better than others. Please use common sense as you try these suggestions and feel free to ask me any questions.   ACTIVITY/FITNESS Mental, social, emotional and physical stimulation are very important for brain and body health. Try learning a new activity (arts, music, language, sports, games).  Keep moving your body to the best of your abilities. You can do this at home, inside or outside, the park, community center, gym or anywhere you like. Consider a physical therapist or personal trainer to get started. Consider the app Sworkit. Fitness trackers such as smart-watches, smart-phones or Fitbits can help as well.   NUTRITION Eat more plants: colorful vegetables, nuts, seeds and berries.  Eat less sugar, salt, preservatives and processed foods.  Avoid toxins such as cigarettes and alcohol.  Drink water when you are thirsty. Warm water with a slice of lemon is an excellent morning drink to start the day.  Consider these websites for more information The Nutrition Source (https://www.henry-hernandez.biz/) Precision Nutrition (WindowBlog.ch)   RELAXATION Consider practicing mindfulness meditation or other relaxation techniques such as deep breathing, prayer, yoga, tai chi, massage. See website mindful.org or the apps Headspace or Calm to help get  started.   SLEEP Try to get at least 7-8+ hours sleep per day. Regular exercise and reduced caffeine will help you sleep better. Practice good sleep hygeine techniques. See website sleep.org for more information.   PLANNING Prepare estate planning, living will, healthcare POA documents. Sometimes this is best planned with the help of an attorney. Theconversationproject.org and agingwithdignity.org are excellent resources.

## 2015-11-12 LAB — CBC WITH DIFFERENTIAL/PLATELET
BASOS: 1 %
Basophils Absolute: 0 10*3/uL (ref 0.0–0.2)
EOS (ABSOLUTE): 0.2 10*3/uL (ref 0.0–0.4)
EOS: 5 %
HEMATOCRIT: 37.8 % (ref 34.0–46.6)
HEMOGLOBIN: 12.3 g/dL (ref 11.1–15.9)
IMMATURE GRANULOCYTES: 1 %
Immature Grans (Abs): 0 10*3/uL (ref 0.0–0.1)
Lymphocytes Absolute: 0.3 10*3/uL — ABNORMAL LOW (ref 0.7–3.1)
Lymphs: 7 %
MCH: 28.5 pg (ref 26.6–33.0)
MCHC: 32.5 g/dL (ref 31.5–35.7)
MCV: 88 fL (ref 79–97)
MONOCYTES: 19 %
MONOS ABS: 0.8 10*3/uL (ref 0.1–0.9)
NEUTROS PCT: 67 %
Neutrophils Absolute: 2.8 10*3/uL (ref 1.4–7.0)
Platelets: 224 10*3/uL (ref 150–379)
RBC: 4.31 x10E6/uL (ref 3.77–5.28)
RDW: 14.9 % (ref 12.3–15.4)
WBC: 4.1 10*3/uL (ref 3.4–10.8)

## 2015-11-12 LAB — COMPREHENSIVE METABOLIC PANEL
ALBUMIN: 3.7 g/dL (ref 3.6–4.8)
ALK PHOS: 108 IU/L (ref 39–117)
ALT: 9 IU/L (ref 0–32)
AST: 15 IU/L (ref 0–40)
Albumin/Globulin Ratio: 1.6 (ref 1.2–2.2)
BUN / CREAT RATIO: 30 — AB (ref 12–28)
BUN: 19 mg/dL (ref 8–27)
Bilirubin Total: 0.6 mg/dL (ref 0.0–1.2)
CALCIUM: 9.4 mg/dL (ref 8.7–10.3)
CO2: 25 mmol/L (ref 18–29)
CREATININE: 0.64 mg/dL (ref 0.57–1.00)
Chloride: 102 mmol/L (ref 96–106)
GFR, EST AFRICAN AMERICAN: 112 mL/min/{1.73_m2} (ref >59)
GFR, EST NON AFRICAN AMERICAN: 97 mL/min/{1.73_m2} (ref >59)
GLOBULIN, TOTAL: 2.3 g/dL (ref 1.5–4.5)
GLUCOSE: 84 mg/dL (ref 65–99)
Potassium: 4.8 mmol/L (ref 3.5–5.2)
SODIUM: 141 mmol/L (ref 134–144)
TOTAL PROTEIN: 6 g/dL (ref 6.0–8.5)

## 2015-11-12 LAB — VITAMIN D 25 HYDROXY (VIT D DEFICIENCY, FRACTURES): Vit D, 25-Hydroxy: 40.2 ng/mL (ref 30.0–100.0)

## 2015-11-13 ENCOUNTER — Telehealth: Payer: Self-pay | Admitting: *Deleted

## 2015-11-13 NOTE — Telephone Encounter (Signed)
Per Dr Marjory Lies, spoke with patient and informed her that her lab results are stable with Vit D much improved form 8 mos ago. She verbalized understanding, appreciation.

## 2016-04-20 ENCOUNTER — Telehealth: Payer: Self-pay | Admitting: *Deleted

## 2016-04-20 NOTE — Telephone Encounter (Signed)
Spoke with patient re: [employer] Patient Assistance fund papers. Advised her there are sections for her to complete, sign and information for her to provide. She requested papers be mailed to her. She will fax back when completed.  She verbalized understanding, appreciation. Papers in addressed envelop and sent to MR for mailing.

## 2016-05-01 ENCOUNTER — Telehealth: Payer: Self-pay | Admitting: *Deleted

## 2016-05-01 NOTE — Telephone Encounter (Signed)
[employer] disability forms completed, signed, sent to MR for processing.

## 2016-05-07 ENCOUNTER — Telehealth: Payer: Self-pay | Admitting: *Deleted

## 2016-05-07 NOTE — Telephone Encounter (Signed)
Pt [employer] form @ front desk for pickup.

## 2016-05-12 ENCOUNTER — Encounter: Payer: Self-pay | Admitting: Diagnostic Neuroimaging

## 2016-05-12 ENCOUNTER — Ambulatory Visit (INDEPENDENT_AMBULATORY_CARE_PROVIDER_SITE_OTHER): Payer: Medicare HMO | Admitting: Diagnostic Neuroimaging

## 2016-05-12 VITALS — BP 128/86 | HR 64 | Wt 176.0 lb

## 2016-05-12 DIAGNOSIS — G35 Multiple sclerosis: Secondary | ICD-10-CM | POA: Diagnosis not present

## 2016-05-12 DIAGNOSIS — R5383 Other fatigue: Secondary | ICD-10-CM

## 2016-05-12 DIAGNOSIS — M25561 Pain in right knee: Secondary | ICD-10-CM | POA: Diagnosis not present

## 2016-05-12 DIAGNOSIS — M25562 Pain in left knee: Secondary | ICD-10-CM | POA: Diagnosis not present
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
## 2016-05-13 ENCOUNTER — Telehealth: Payer: Self-pay | Admitting: *Deleted

## 2016-05-13 LAB — CBC WITH DIFFERENTIAL/PLATELET
BASOS ABS: 0 10*3/uL (ref 0.0–0.2)
Basos: 0 %
EOS (ABSOLUTE): 0.1 10*3/uL (ref 0.0–0.4)
Eos: 3 %
HEMOGLOBIN: 11.7 g/dL (ref 11.1–15.9)
Hematocrit: 36.5 % (ref 34.0–46.6)
IMMATURE GRANS (ABS): 0 10*3/uL (ref 0.0–0.1)
IMMATURE GRANULOCYTES: 0 %
LYMPHS ABS: 0.2 10*3/uL — AB (ref 0.7–3.1)
LYMPHS: 7 %
MCH: 28.7 pg (ref 26.6–33.0)
MCHC: 32.1 g/dL (ref 31.5–35.7)
MCV: 90 fL (ref 79–97)
MONOCYTES: 14 %
Monocytes Absolute: 0.5 10*3/uL (ref 0.1–0.9)
NEUTROS PCT: 76 %
Neutrophils Absolute: 2.5 10*3/uL (ref 1.4–7.0)
Platelets: 230 10*3/uL (ref 150–379)
RBC: 4.07 x10E6/uL (ref 3.77–5.28)
RDW: 14.2 % (ref 12.3–15.4)
WBC: 3.3 10*3/uL — AB (ref 3.4–10.8)

## 2016-05-13 LAB — COMPREHENSIVE METABOLIC PANEL
ALBUMIN: 3.9 g/dL (ref 3.6–4.8)
ALK PHOS: 122 IU/L — AB (ref 39–117)
ALT: 13 IU/L (ref 0–32)
AST: 15 IU/L (ref 0–40)
Albumin/Globulin Ratio: 1.9 (ref 1.2–2.2)
BUN / CREAT RATIO: 37 — AB (ref 12–28)
BUN: 23 mg/dL (ref 8–27)
Bilirubin Total: 0.7 mg/dL (ref 0.0–1.2)
CALCIUM: 9.5 mg/dL (ref 8.7–10.3)
CO2: 27 mmol/L (ref 18–29)
CREATININE: 0.62 mg/dL (ref 0.57–1.00)
Chloride: 104 mmol/L (ref 96–106)
GFR calc Af Amer: 113 mL/min/{1.73_m2} (ref >59)
GFR calc non Af Amer: 98 mL/min/{1.73_m2} (ref >59)
GLUCOSE: 87 mg/dL (ref 65–99)
Globulin, Total: 2.1 g/dL (ref 1.5–4.5)
Potassium: 5.2 mmol/L (ref 3.5–5.2)
Sodium: 144 mmol/L (ref 134–144)
Total Protein: 6 g/dL (ref 6.0–8.5)

## 2016-05-13 LAB — VITAMIN D 25 HYDROXY (VIT D DEFICIENCY, FRACTURES): VIT D 25 HYDROXY: 58 ng/mL (ref 30.0–100.0)

## 2016-05-13 NOTE — Telephone Encounter (Signed)
Per Dr Marjory Lies, spoke with patient and informed her that her labs results are unremarkable with exception of slightly low WBCs and lymphocytes. Advised he will repeat her lab in one month, and this RN will call and remind her. Advised she won't need an appointment, just come in during regular office hours. She verbalized understanding, appreciation for call.

## 2016-06-04 ENCOUNTER — Telehealth: Payer: Self-pay | Admitting: *Deleted

## 2016-06-04 ENCOUNTER — Other Ambulatory Visit: Payer: Self-pay | Admitting: *Deleted

## 2016-06-04 DIAGNOSIS — G35 Multiple sclerosis: Secondary | ICD-10-CM

## 2016-06-04 DIAGNOSIS — R7989 Other specified abnormal findings of blood chemistry: Secondary | ICD-10-CM

## 2016-06-04 NOTE — Telephone Encounter (Signed)
Spoke with patient and reminded her that she needs to come in next week for repeat labs per Dr Marjory Lies. Advised she does not need an appointment, but requested her not come during lunch hour.  She verbalized understanding, appreciation.

## 2016-06-08 ENCOUNTER — Other Ambulatory Visit (INDEPENDENT_AMBULATORY_CARE_PROVIDER_SITE_OTHER): Payer: Self-pay

## 2016-06-08 DIAGNOSIS — Z0289 Encounter for other administrative examinations: Secondary | ICD-10-CM

## 2016-06-08 DIAGNOSIS — G35 Multiple sclerosis: Secondary | ICD-10-CM

## 2016-06-08 DIAGNOSIS — R7989 Other specified abnormal findings of blood chemistry: Secondary | ICD-10-CM

## 2016-06-09 LAB — CBC WITH DIFFERENTIAL/PLATELET
BASOS ABS: 0 10*3/uL (ref 0.0–0.2)
Basos: 0 %
EOS (ABSOLUTE): 0.2 10*3/uL (ref 0.0–0.4)
Eos: 4 %
Hematocrit: 35.8 % (ref 34.0–46.6)
Hemoglobin: 11.9 g/dL (ref 11.1–15.9)
Immature Grans (Abs): 0 10*3/uL (ref 0.0–0.1)
Immature Granulocytes: 1 %
LYMPHS ABS: 0.4 10*3/uL — AB (ref 0.7–3.1)
Lymphs: 12 %
MCH: 28.7 pg (ref 26.6–33.0)
MCHC: 33.2 g/dL (ref 31.5–35.7)
MCV: 87 fL (ref 79–97)
MONOS ABS: 0.3 10*3/uL (ref 0.1–0.9)
Monocytes: 10 %
Neutrophils Absolute: 2.5 10*3/uL (ref 1.4–7.0)
Neutrophils: 73 %
PLATELETS: 247 10*3/uL (ref 150–379)
RBC: 4.14 x10E6/uL (ref 3.77–5.28)
RDW: 14.3 % (ref 12.3–15.4)
WBC: 3.4 10*3/uL (ref 3.4–10.8)

## 2016-06-12 ENCOUNTER — Telehealth: Payer: Self-pay | Admitting: *Deleted

## 2016-06-12 NOTE — Telephone Encounter (Signed)
Per Dr Marjory Lies, spoke with patient and informed her that her labs are stable. Advised her there are no changes in her current treatment plan. She had no questions, verbalized understanding, appreciation.

## 2016-07-01 ENCOUNTER — Telehealth: Payer: Self-pay | Admitting: Diagnostic Neuroimaging

## 2016-07-01 NOTE — Telephone Encounter (Signed)
Aloha Gell with [employer] called in reference to patient trying to re enroll for patient assistant with [employer] for cost of Gilenya.  Requesting we submit an prior authorization to [facility] for Gilenya Please call

## 2016-07-02 MED ORDER — FINGOLIMOD HCL 0.5 MG PO CAPS: 0.5000 mg | ORAL_CAPSULE | Freq: Every day | ORAL | 4 refills | Status: DC

## 2016-07-02 NOTE — Addendum Note (Signed)
Addended by: [place] Pink on: 07/02/2016 10:57 AM   Modules accepted: Orders

## 2016-07-02 NOTE — Telephone Encounter (Addendum)
LVM for patient advising her that per her EMR, PA is in effect until 10/09/17. Then called [employer], spoke with Shari Prows who stated PA must be redone every year.  He advised calling [employer] to begin process. Spoke with Abran Richard who stated PA is still valid through 10/09/2017.  Gilenya PA # [id number], GPN [id number].   [employer] again, spoke with [place], who stated PA still had to be done. This RN explained that it is NA per [facility]. Serafina Royals spoke with team manager Rinaldo Cloud who requested copy of [employer] PA for Gilenya be faxed to [phone].  [employer] Pt ID [id number].  Information successfully faxed.

## 2016-07-02 NOTE — Telephone Encounter (Signed)
Pt called said [employer] needs new RX and PA. I advised her it was in process.

## 2016-11-11 ENCOUNTER — Ambulatory Visit: Payer: Medicare HMO | Admitting: Diagnostic Neuroimaging

## 2016-12-31 ENCOUNTER — Telehealth: Payer: Self-pay | Admitting: Diagnostic Neuroimaging

## 2016-12-31 ENCOUNTER — Other Ambulatory Visit: Payer: Self-pay | Admitting: Diagnostic Neuroimaging

## 2016-12-31 DIAGNOSIS — G35 Multiple sclerosis: Secondary | ICD-10-CM

## 2016-12-31 NOTE — Telephone Encounter (Signed)
Spoke with patient and informed her that Dr Marjory Lies put in her lab orders. Advised her to check in and ask front desk to message Victorino Dike, RN to bring her back for her labs. She will then be ready to see Dr Marjory Lies for follow up. She verbalized understanding, appreciation.

## 2016-12-31 NOTE — Telephone Encounter (Signed)
Pt would like to know if her lab work can be done before her check in of 11:00 for her 11:30 appointment on tomorrow.  Pt said that she could arrive at 10:30, she would like a call back with a response

## 2017-01-01 ENCOUNTER — Encounter: Payer: Self-pay | Admitting: Diagnostic Neuroimaging

## 2017-01-01 ENCOUNTER — Other Ambulatory Visit (INDEPENDENT_AMBULATORY_CARE_PROVIDER_SITE_OTHER): Payer: Self-pay

## 2017-01-01 ENCOUNTER — Ambulatory Visit (INDEPENDENT_AMBULATORY_CARE_PROVIDER_SITE_OTHER): Payer: Medicare HMO | Admitting: Diagnostic Neuroimaging

## 2017-01-01 VITALS — BP 168/86 | HR 45 | Ht 65.5 in | Wt 176.0 lb

## 2017-01-01 DIAGNOSIS — G35 Multiple sclerosis: Secondary | ICD-10-CM

## 2017-01-01 DIAGNOSIS — Z0289 Encounter for other administrative examinations: Secondary | ICD-10-CM

## 2017-01-01 DIAGNOSIS — R001 Bradycardia, unspecified: Secondary | ICD-10-CM | POA: Diagnosis not present
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
## 2017-01-02 LAB — CBC WITH DIFFERENTIAL/PLATELET
BASOS ABS: 0 10*3/uL (ref 0.0–0.2)
BASOS: 0 %
EOS (ABSOLUTE): 0.1 10*3/uL (ref 0.0–0.4)
Eos: 3 %
Hematocrit: 38.3 % (ref 34.0–46.6)
Hemoglobin: 12.6 g/dL (ref 11.1–15.9)
Immature Grans (Abs): 0 10*3/uL (ref 0.0–0.1)
Immature Granulocytes: 1 %
Lymphocytes Absolute: 0.4 10*3/uL — ABNORMAL LOW (ref 0.7–3.1)
Lymphs: 12 %
MCH: 28.4 pg (ref 26.6–33.0)
MCHC: 32.9 g/dL (ref 31.5–35.7)
MCV: 87 fL (ref 79–97)
MONOS ABS: 0.3 10*3/uL (ref 0.1–0.9)
Monocytes: 7 %
NEUTROS PCT: 77 %
Neutrophils Absolute: 2.9 10*3/uL (ref 1.4–7.0)
PLATELETS: 234 10*3/uL (ref 150–379)
RBC: 4.43 x10E6/uL (ref 3.77–5.28)
RDW: 14.7 % (ref 12.3–15.4)
WBC: 3.7 10*3/uL (ref 3.4–10.8)

## 2017-01-02 LAB — COMPREHENSIVE METABOLIC PANEL
A/G RATIO: 1.8 (ref 1.2–2.2)
ALK PHOS: 124 IU/L — AB (ref 39–117)
ALT: 9 IU/L (ref 0–32)
AST: 15 IU/L (ref 0–40)
Albumin: 4 g/dL (ref 3.6–4.8)
BILIRUBIN TOTAL: 0.6 mg/dL (ref 0.0–1.2)
BUN/Creatinine Ratio: 35 — ABNORMAL HIGH (ref 12–28)
BUN: 23 mg/dL (ref 8–27)
CALCIUM: 9.1 mg/dL (ref 8.7–10.3)
CHLORIDE: 102 mmol/L (ref 96–106)
CO2: 26 mmol/L (ref 20–29)
Creatinine, Ser: 0.66 mg/dL (ref 0.57–1.00)
GFR calc Af Amer: 110 mL/min/{1.73_m2} (ref >59)
GFR, EST NON AFRICAN AMERICAN: 96 mL/min/{1.73_m2} (ref >59)
GLOBULIN, TOTAL: 2.2 g/dL (ref 1.5–4.5)
Glucose: 83 mg/dL (ref 65–99)
POTASSIUM: 4.3 mmol/L (ref 3.5–5.2)
SODIUM: 139 mmol/L (ref 134–144)
Total Protein: 6.2 g/dL (ref 6.0–8.5)

## 2017-01-02 LAB — VITAMIN D 25 HYDROXY (VIT D DEFICIENCY, FRACTURES): Vit D, 25-Hydroxy: 49.4 ng/mL (ref 30.0–100.0)

## 2017-01-06 ENCOUNTER — Telehealth: Payer: Self-pay | Admitting: *Deleted

## 2017-01-06 NOTE — Telephone Encounter (Signed)
Spoke with patient and informed her that her labs are overall stable, unremarkable labs. Advised her that Dr Marjory Lies will continue with her current plan. Patient verbalized understanding, requested labs be faxed to her PCP. Advised her this RN will fax. She verbalized appreciation. Labs successfully faxed.

## 2017-02-24 ENCOUNTER — Other Ambulatory Visit: Payer: Self-pay | Admitting: Physician Assistant

## 2017-02-24 DIAGNOSIS — Z1231 Encounter for screening mammogram for malignant neoplasm of breast: Secondary | ICD-10-CM

## 2017-03-22 ENCOUNTER — Ambulatory Visit: Payer: Medicare HMO

## 2017-04-14 ENCOUNTER — Ambulatory Visit
Admission: RE | Admit: 2017-04-14 | Discharge: 2017-04-14 | Disposition: A | Discharge status: Home / Self Care | Payer: Medicare HMO | Source: Ambulatory Visit | Attending: Physician Assistant | Admitting: Physician Assistant

## 2017-04-14 DIAGNOSIS — Z1231 Encounter for screening mammogram for malignant neoplasm of breast: Secondary | ICD-10-CM

## 2017-04-19 ENCOUNTER — Other Ambulatory Visit: Payer: Self-pay | Admitting: Physician Assistant

## 2017-04-19 DIAGNOSIS — R928 Other abnormal and inconclusive findings on diagnostic imaging of breast: Secondary | ICD-10-CM

## 2017-04-23 ENCOUNTER — Ambulatory Visit: Payer: Medicare HMO

## 2017-04-23 ENCOUNTER — Ambulatory Visit
Admission: RE | Admit: 2017-04-23 | Discharge: 2017-04-23 | Disposition: A | Discharge status: Home / Self Care | Payer: Medicare HMO | Source: Ambulatory Visit | Attending: Physician Assistant | Admitting: Physician Assistant

## 2017-04-23 DIAGNOSIS — R928 Other abnormal and inconclusive findings on diagnostic imaging of breast: Secondary | ICD-10-CM

## 2017-04-28 ENCOUNTER — Telehealth: Payer: Self-pay | Admitting: Diagnostic Neuroimaging

## 2017-04-28 NOTE — Telephone Encounter (Signed)
[employer] and its time for 2019 renewal for patient assistance . I have spoke to patient and she is aware of her part I will speak to her next week . Waiting on the form from [employer] .  Gilenya 0.5 mg

## 2017-05-05 ENCOUNTER — Other Ambulatory Visit: Payer: Self-pay | Admitting: Diagnostic Neuroimaging

## 2017-05-05 ENCOUNTER — Telehealth: Payer: Self-pay | Admitting: Diagnostic Neuroimaging

## 2017-05-05 MED ORDER — FINGOLIMOD HCL 0.5 MG PO CAPS
0.5000 mg | ORAL_CAPSULE | Freq: Every day | ORAL | 4 refills | Status: DC
Start: 2017-05-05 — End: 2017-06-15

## 2017-05-05 NOTE — Telephone Encounter (Signed)
Pt asking that her Fingolimod HCl (GILENYA) 0.5 MG CAPS be called into  [facility] [place] - [address] [phone] (Phone) [phone] (Fax)

## 2017-05-05 NOTE — Addendum Note (Signed)
Addended by: [place] PinkHESSON, MARY C on: 05/05/2017 04:43 PM   Modules accepted: Orders

## 2017-05-05 NOTE — Telephone Encounter (Signed)
Doristine Section   RN  is helping me with RX and I am up dating 2019 renewal for  Fingolimod HCl (GILENYA) 0.5 MG CAPS

## 2017-05-05 NOTE — Telephone Encounter (Signed)
Pt called she has been out of the medication since Friday. She is wanting RX expedited. Please call to advise at [phone]

## 2017-05-05 NOTE — Telephone Encounter (Signed)
Darreld Mclean, patient assistance coordinator spoke with patient who stated [facility] specialty pharmacy requires a refill Rx for [place] faxed to them. Gilenya Rx printed, on Dr [employer] desk for signature.

## 2017-05-06 NOTE — Telephone Encounter (Signed)
RX for Fingolimod HCl (GILENYA) 0.5 MG CAPS had to sent to a different fax fax [phone]- Patient enrollment for 2019 has been done and submitted.

## 2017-05-06 NOTE — Telephone Encounter (Signed)
Pt has returned the call to [facility], she is asking for a call back re: her Gilenya

## 2017-05-13 NOTE — Telephone Encounter (Signed)
error 

## 2017-05-20 ENCOUNTER — Telehealth: Payer: Self-pay | Admitting: *Deleted

## 2017-05-20 NOTE — Telephone Encounter (Signed)
Pt medical records faxed to [facility] [phone]

## 2017-05-26 ENCOUNTER — Telehealth: Payer: Self-pay | Admitting: *Deleted

## 2017-05-27 NOTE — Telephone Encounter (Signed)
Called and left patient a message to call me back.

## 2017-06-02 ENCOUNTER — Telehealth: Payer: Self-pay | Admitting: Diagnostic Neuroimaging

## 2017-06-02 NOTE — Telephone Encounter (Signed)
Dr. Marjory Lies signed patient assistance forms and I will mail forms to patient and she will mail to [employer] her self for Gilenya.

## 2017-06-15 ENCOUNTER — Telehealth: Payer: Self-pay | Admitting: Diagnostic Neuroimaging

## 2017-06-15 MED ORDER — FINGOLIMOD HCL 0.5 MG PO CAPS: 0.5000 mg | ORAL_CAPSULE | Freq: Every day | ORAL | 4 refills | Status: DC

## 2017-06-15 NOTE — Telephone Encounter (Signed)
[employer] Patient assistance program called and relayed they need a new script faxed for her  Fingolimod HCl (GILENYA) 0.5 MG . Please fax to [employer] fax # [phone]. Thanks [place].

## 2017-06-15 NOTE — Addendum Note (Signed)
Addended by: Guy Begin on: 06/15/2017 02:02 PM   Modules accepted: Orders

## 2017-06-15 NOTE — Telephone Encounter (Signed)
Fax confirmation received  Gilenya Pt asssistance program ([employer])  [phone].

## 2017-06-21 ENCOUNTER — Emergency Department (HOSPITAL_COMMUNITY): Payer: Medicare HMO

## 2017-06-21 ENCOUNTER — Other Ambulatory Visit: Payer: Self-pay

## 2017-06-21 ENCOUNTER — Encounter (HOSPITAL_COMMUNITY): Payer: Self-pay

## 2017-06-21 ENCOUNTER — Encounter: Payer: Self-pay | Admitting: Orthopedic Surgery

## 2017-06-21 ENCOUNTER — Inpatient Hospital Stay (HOSPITAL_COMMUNITY)
Admission: EM | Admit: 2017-06-21 | Discharge: 2017-06-28 | DRG: 481 | Disposition: A | Discharge status: Skilled Nursing Facility | Payer: Medicare HMO | Attending: Internal Medicine | Admitting: Internal Medicine

## 2017-06-21 DIAGNOSIS — Z888 Allergy status to other drugs, medicaments and biological substances status: Secondary | ICD-10-CM | POA: Diagnosis not present

## 2017-06-21 DIAGNOSIS — I10 Essential (primary) hypertension: Secondary | ICD-10-CM

## 2017-06-21 DIAGNOSIS — K5900 Constipation, unspecified: Secondary | ICD-10-CM | POA: Diagnosis present

## 2017-06-21 DIAGNOSIS — Z79899 Other long term (current) drug therapy: Secondary | ICD-10-CM

## 2017-06-21 DIAGNOSIS — S72451A Displaced supracondylar fracture without intracondylar extension of lower end of right femur, initial encounter for closed fracture: Secondary | ICD-10-CM | POA: Diagnosis present

## 2017-06-21 DIAGNOSIS — K219 Gastro-esophageal reflux disease without esophagitis: Secondary | ICD-10-CM | POA: Diagnosis present

## 2017-06-21 DIAGNOSIS — I5032 Chronic diastolic (congestive) heart failure: Secondary | ICD-10-CM | POA: Diagnosis present

## 2017-06-21 DIAGNOSIS — I110 Hypertensive heart disease with heart failure: Secondary | ICD-10-CM | POA: Diagnosis present

## 2017-06-21 DIAGNOSIS — M1711 Unilateral primary osteoarthritis, right knee: Secondary | ICD-10-CM

## 2017-06-21 DIAGNOSIS — G35 Multiple sclerosis: Secondary | ICD-10-CM | POA: Diagnosis present

## 2017-06-21 DIAGNOSIS — Z791 Long term (current) use of non-steroidal anti-inflammatories (NSAID): Secondary | ICD-10-CM | POA: Diagnosis not present

## 2017-06-21 DIAGNOSIS — M170 Bilateral primary osteoarthritis of knee: Secondary | ICD-10-CM | POA: Diagnosis present

## 2017-06-21 DIAGNOSIS — D62 Acute posthemorrhagic anemia: Secondary | ICD-10-CM | POA: Diagnosis not present

## 2017-06-21 DIAGNOSIS — Y92008 Other place in unspecified non-institutional (private) residence as the place of occurrence of the external cause: Secondary | ICD-10-CM

## 2017-06-21 DIAGNOSIS — I340 Nonrheumatic mitral (valve) insufficiency: Secondary | ICD-10-CM | POA: Diagnosis not present

## 2017-06-21 DIAGNOSIS — R55 Syncope and collapse: Secondary | ICD-10-CM

## 2017-06-21 DIAGNOSIS — W1830XA Fall on same level, unspecified, initial encounter: Secondary | ICD-10-CM | POA: Diagnosis present

## 2017-06-21 DIAGNOSIS — R197 Diarrhea, unspecified: Secondary | ICD-10-CM | POA: Diagnosis not present

## 2017-06-21 DIAGNOSIS — S72321S Displaced transverse fracture of shaft of right femur, sequela: Secondary | ICD-10-CM | POA: Diagnosis not present

## 2017-06-21 DIAGNOSIS — Z419 Encounter for procedure for purposes other than remedying health state, unspecified: Secondary | ICD-10-CM

## 2017-06-21 DIAGNOSIS — R0681 Apnea, not elsewhere classified: Secondary | ICD-10-CM | POA: Diagnosis present

## 2017-06-21 DIAGNOSIS — S72401A Unspecified fracture of lower end of right femur, initial encounter for closed fracture: Secondary | ICD-10-CM | POA: Diagnosis not present

## 2017-06-21 DIAGNOSIS — D500 Iron deficiency anemia secondary to blood loss (chronic): Secondary | ICD-10-CM | POA: Diagnosis not present

## 2017-06-21 DIAGNOSIS — Z7982 Long term (current) use of aspirin: Secondary | ICD-10-CM | POA: Diagnosis not present

## 2017-06-21 DIAGNOSIS — I447 Left bundle-branch block, unspecified: Secondary | ICD-10-CM | POA: Diagnosis present

## 2017-06-21 DIAGNOSIS — R002 Palpitations: Secondary | ICD-10-CM | POA: Diagnosis not present

## 2017-06-21 DIAGNOSIS — S81011A Laceration without foreign body, right knee, initial encounter: Secondary | ICD-10-CM | POA: Diagnosis present

## 2017-06-21 DIAGNOSIS — S7290XA Unspecified fracture of unspecified femur, initial encounter for closed fracture: Secondary | ICD-10-CM

## 2017-06-21 DIAGNOSIS — S72301A Unspecified fracture of shaft of right femur, initial encounter for closed fracture: Secondary | ICD-10-CM | POA: Diagnosis not present

## 2017-06-21 DIAGNOSIS — Z7409 Other reduced mobility: Secondary | ICD-10-CM | POA: Diagnosis not present

## 2017-06-21 DIAGNOSIS — D649 Anemia, unspecified: Secondary | ICD-10-CM | POA: Diagnosis present

## 2017-06-21 DIAGNOSIS — S7291XA Unspecified fracture of right femur, initial encounter for closed fracture: Secondary | ICD-10-CM | POA: Diagnosis present

## 2017-06-21 LAB — CBC WITH DIFFERENTIAL/PLATELET
BASOS ABS: 0 10*3/uL (ref 0.0–0.1)
Basophils Relative: 0 %
EOS PCT: 0 %
Eosinophils Absolute: 0 10*3/uL (ref 0.0–0.7)
HCT: 37.5 % (ref 36.0–46.0)
Hemoglobin: 11.8 g/dL — ABNORMAL LOW (ref 12.0–15.0)
LYMPHS PCT: 1 %
Lymphs Abs: 0.2 10*3/uL — ABNORMAL LOW (ref 0.7–4.0)
MCH: 28.4 pg (ref 26.0–34.0)
MCHC: 31.5 g/dL (ref 30.0–36.0)
MCV: 90.1 fL (ref 78.0–100.0)
Monocytes Absolute: 0.8 10*3/uL (ref 0.1–1.0)
Monocytes Relative: 6 %
Neutro Abs: 12.4 10*3/uL — ABNORMAL HIGH (ref 1.7–7.7)
Neutrophils Relative %: 93 %
PLATELETS: 243 10*3/uL (ref 150–400)
RBC: 4.16 MIL/uL (ref 3.87–5.11)
RDW: 13.5 % (ref 11.5–15.5)
WBC: 13.4 10*3/uL — AB (ref 4.0–10.5)

## 2017-06-21 LAB — COMPREHENSIVE METABOLIC PANEL
ALT: 19 U/L (ref 14–54)
AST: 25 U/L (ref 15–41)
Albumin: 3.5 g/dL (ref 3.5–5.0)
Alkaline Phosphatase: 117 U/L (ref 38–126)
Anion gap: 10 (ref 5–15)
BUN: 23 mg/dL — ABNORMAL HIGH (ref 6–20)
CHLORIDE: 104 mmol/L (ref 101–111)
CO2: 27 mmol/L (ref 22–32)
CREATININE: 0.6 mg/dL (ref 0.44–1.00)
Calcium: 9 mg/dL (ref 8.9–10.3)
Glucose, Bld: 122 mg/dL — ABNORMAL HIGH (ref 65–99)
Potassium: 3.7 mmol/L (ref 3.5–5.1)
Sodium: 141 mmol/L (ref 135–145)
Total Bilirubin: 0.8 mg/dL (ref 0.3–1.2)
Total Protein: 6.2 g/dL — ABNORMAL LOW (ref 6.5–8.1)

## 2017-06-21 LAB — LIPASE, BLOOD: LIPASE: 27 U/L (ref 11–51)

## 2017-06-21 LAB — I-STAT TROPONIN, ED: Troponin i, poc: 0.03 ng/mL (ref 0.00–0.08)

## 2017-06-21 LAB — MAGNESIUM: MAGNESIUM: 1.6 mg/dL — AB (ref 1.7–2.4)

## 2017-06-21 LAB — CBG MONITORING, ED: GLUCOSE-CAPILLARY: 97 mg/dL (ref 65–99)

## 2017-06-21 MED ORDER — ENOXAPARIN SODIUM 40 MG/0.4ML ~~LOC~~ SOLN
40.0000 mg | SUBCUTANEOUS | Status: DC
  Administered 2017-06-21 – 2017-06-27 (×7): 40 mg via SUBCUTANEOUS
  Filled 2017-06-21 (×7): qty 0.4

## 2017-06-21 MED ORDER — HYDROCODONE-ACETAMINOPHEN 5-325 MG PO TABS
1.0000 | ORAL_TABLET | Freq: Four times a day (QID) | ORAL | Status: DC | PRN
  Administered 2017-06-22 – 2017-06-26 (×12): 2 via ORAL
  Administered 2017-06-26: 1 via ORAL
  Administered 2017-06-26 – 2017-06-27 (×3): 2 via ORAL
  Administered 2017-06-27: 1 via ORAL
  Administered 2017-06-28: 2 via ORAL
  Filled 2017-06-21 (×17): qty 2
  Filled 2017-06-21: qty 1
  Filled 2017-06-21 (×2): qty 2

## 2017-06-21 MED ORDER — KETOROLAC TROMETHAMINE 30 MG/ML IJ SOLN
30.0000 mg | Freq: Four times a day (QID) | INTRAMUSCULAR | Status: AC | PRN
  Administered 2017-06-22: 30 mg via INTRAVENOUS
  Filled 2017-06-21: qty 1

## 2017-06-21 MED ORDER — PANTOPRAZOLE SODIUM 40 MG PO TBEC
40.0000 mg | DELAYED_RELEASE_TABLET | Freq: Every day | ORAL | Status: DC
  Administered 2017-06-21 – 2017-06-28 (×8): 40 mg via ORAL
  Filled 2017-06-21 (×8): qty 1

## 2017-06-21 MED ORDER — FINGOLIMOD HCL 0.5 MG PO CAPS
0.5000 mg | ORAL_CAPSULE | Freq: Every day | ORAL | Status: DC
  Administered 2017-06-22 – 2017-06-28 (×6): 0.5 mg via ORAL
  Filled 2017-06-21 (×2): qty 1

## 2017-06-21 MED ORDER — VITAMIN D 1000 UNITS PO TABS
1000.0000 [IU] | ORAL_TABLET | Freq: Every day | ORAL | Status: DC
  Administered 2017-06-22 – 2017-06-27 (×7): 1000 [IU] via ORAL
  Filled 2017-06-21 (×9): qty 1

## 2017-06-21 MED ORDER — FENTANYL CITRATE (PF) 100 MCG/2ML IJ SOLN: 50.0000 ug | Freq: Once | INTRAMUSCULAR | Status: DC

## 2017-06-21 MED ORDER — KETOROLAC TROMETHAMINE 30 MG/ML IJ SOLN
30.0000 mg | Freq: Once | INTRAMUSCULAR | Status: AC
  Administered 2017-06-21: 30 mg via INTRAVENOUS
  Filled 2017-06-21: qty 1

## 2017-06-21 MED ORDER — SODIUM CHLORIDE 0.9 % IV SOLN
INTRAVENOUS | Status: DC
  Administered 2017-06-21 – 2017-06-22 (×2): via INTRAVENOUS

## 2017-06-21 MED ORDER — ACETAMINOPHEN 325 MG PO TABS
650.0000 mg | ORAL_TABLET | Freq: Four times a day (QID) | ORAL | Status: DC | PRN
  Administered 2017-06-26 – 2017-06-27 (×2): 650 mg via ORAL
  Filled 2017-06-21 (×2): qty 2

## 2017-06-21 MED ORDER — ONDANSETRON HCL 4 MG/2ML IJ SOLN
4.0000 mg | Freq: Once | INTRAMUSCULAR | Status: AC
  Administered 2017-06-21: 4 mg via INTRAVENOUS
  Filled 2017-06-21: qty 2

## 2017-06-21 MED ORDER — LIDOCAINE HCL (PF) 1 % IJ SOLN
5.0000 mL | Freq: Once | INTRAMUSCULAR | Status: AC
  Administered 2017-06-21: 5 mL
  Filled 2017-06-21: qty 6

## 2017-06-21 MED ORDER — POLYETHYLENE GLYCOL 3350 17 G PO PACK
17.0000 g | PACK | Freq: Every day | ORAL | Status: DC
  Filled 2017-06-21 (×3): qty 1

## 2017-06-21 MED ORDER — ACETAMINOPHEN 650 MG RE SUPP: 650.0000 mg | Freq: Four times a day (QID) | RECTAL | Status: DC | PRN

## 2017-06-21 MED ORDER — LOSARTAN POTASSIUM 50 MG PO TABS
50.0000 mg | ORAL_TABLET | Freq: Every day | ORAL | Status: DC
  Administered 2017-06-21 – 2017-06-26 (×6): 50 mg via ORAL
  Filled 2017-06-21 (×2): qty 1
  Filled 2017-06-21: qty 2
  Filled 2017-06-21 (×3): qty 1

## 2017-06-21 MED ORDER — CEFAZOLIN SODIUM-DEXTROSE 1-4 GM/50ML-% IV SOLN
1.0000 g | Freq: Three times a day (TID) | INTRAVENOUS | Status: DC
  Administered 2017-06-22 (×2): 1 g via INTRAVENOUS
  Filled 2017-06-21 (×5): qty 50

## 2017-06-21 MED ORDER — CEFAZOLIN SODIUM-DEXTROSE 1-4 GM/50ML-% IV SOLN
1.0000 g | Freq: Once | INTRAVENOUS | Status: AC
  Administered 2017-06-21: 1 g via INTRAVENOUS
  Filled 2017-06-21: qty 50

## 2017-06-21 MED ORDER — METHOCARBAMOL 1000 MG/10ML IJ SOLN
500.0000 mg | Freq: Four times a day (QID) | INTRAMUSCULAR | Status: DC | PRN
  Filled 2017-06-21: qty 5

## 2017-06-21 MED ORDER — ASPIRIN 81 MG PO CHEW
81.0000 mg | CHEWABLE_TABLET | Freq: Every day | ORAL | Status: DC
  Administered 2017-06-21 – 2017-06-28 (×8): 81 mg via ORAL
  Filled 2017-06-21 (×8): qty 1

## 2017-06-21 MED ORDER — METHOCARBAMOL 500 MG PO TABS
500.0000 mg | ORAL_TABLET | Freq: Four times a day (QID) | ORAL | Status: DC | PRN
  Administered 2017-06-22 – 2017-06-28 (×7): 500 mg via ORAL
  Filled 2017-06-21 (×7): qty 1

## 2017-06-21 MED ORDER — SODIUM CHLORIDE 0.9 % IV BOLUS (SEPSIS)
500.0000 mL | Freq: Once | INTRAVENOUS | Status: AC
  Administered 2017-06-21: 500 mL via INTRAVENOUS
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
## 2017-06-21 NOTE — ED Notes (Signed)
Returned from xray

## 2017-06-21 NOTE — ED Triage Notes (Signed)
Patient brought in by [facility] from home. Patient states she was feeding cat this morning and felt hot, states she woke up in the floor laying on right leg. Swelling noted to right knee. Denies neck pain or head injury.  Gave morphine 4mg , patient then went apenic, heart rate went to 44 BPM. Then EMS gave patient 2g Narcan.   24G in right wrist CBG 79

## 2017-06-21 NOTE — ED Notes (Signed)
Report given to Suzette Battiest, RN @ [facility].

## 2017-06-21 NOTE — Progress Notes (Signed)
Patient ID: Tiffany Barnes, female   DOB: 1955/11/27, 62 y.o.   MRN: [id number]  BP 119/65 (BP Location: Left Arm)   Pulse 71   Temp 97.6 F (36.4 C) (Oral)   Resp 12   Ht 5\' 5"  (1.651 m)   Wt 175 lb (79.4 kg)   SpO2 98%   BMI 29.12 kg/m  Chief Complaint  Patient presents with  . Loss of Consciousness  . Leg Pain    62 year old female had a syncope episode today she has a fracture of the distal femur with comminution.  I was asked to evaluate the fracture for possible fixation.  This is a comminuted distal femur fracture in an osteopenic patient who also had a syncopal episode  The patient does need to have open reduction internal fixation and she needs to be transferred for further management secondary to the syncopal episode which will require further workup and postop management as well as the complexity of the fracture.  She also has a laceration over the patella for which I recommended that Ancef be given because of proximity to the fracture

## 2017-06-21 NOTE — ED Notes (Signed)
Patient transported to X-ray 

## 2017-06-21 NOTE — Consult Note (Signed)
Case discussed with Dr. Jacqulyn Bath. Low energy fall after syncopal episode with extra-articular distal femur fracture. CT scan shows no intra-articular extension but end-stage knee arthritis. Plan for ORIF tomorrow. There is laceration over knee cap, gas only appears in soft tissue and not to the bone. Low likelihood of open fracture but would recommend Ancef 2gm q8hrs until surgery. NPO after midnight. Full consult to follow in AM.  Roby Lofts, MD [facility] [phone] (phone)

## 2017-06-22 ENCOUNTER — Inpatient Hospital Stay (HOSPITAL_COMMUNITY): Payer: Medicare HMO

## 2017-06-22 ENCOUNTER — Encounter (HOSPITAL_COMMUNITY): Payer: Self-pay | Admitting: *Deleted

## 2017-06-22 ENCOUNTER — Encounter (HOSPITAL_COMMUNITY)
Admission: EM | Disposition: A | Discharge status: Skilled Nursing Facility | Payer: Self-pay | Source: Home / Self Care | Attending: Internal Medicine

## 2017-06-22 DIAGNOSIS — S72401A Unspecified fracture of lower end of right femur, initial encounter for closed fracture: Secondary | ICD-10-CM

## 2017-06-22 DIAGNOSIS — I340 Nonrheumatic mitral (valve) insufficiency: Secondary | ICD-10-CM

## 2017-06-22 HISTORY — PX: ORIF FEMUR FRACTURE: SHX2119

## 2017-06-22 LAB — ECHOCARDIOGRAM COMPLETE
AOASC: 33 cm
AVPHT: 841 ms
Area-P 1/2: 2.59 cm2
CHL CUP MV DEC (S): 289
E decel time: 289 msec
E/e' ratio: 8.92
FS: 34 % (ref 28–44)
Height: 65 in
IVS/LV PW RATIO, ED: 0.85
LA diam end sys: 26 mm
LA vol A4C: 27.3 ml
LA vol: 38.2 mL
LADIAMINDEX: 1.35 cm/m2
LASIZE: 26 mm
LAVOLIN: 19.8 mL/m2
LV e' LATERAL: 7.72 cm/s
LVEEAVG: 8.92
LVEEMED: 8.92
LVOT area: 3.14 cm2
LVOT diameter: 20 mm
Lateral S' vel: 18.6 cm/s
MVPKAVEL: 73.2 m/s
MVPKEVEL: 68.9 m/s
MVSPHT: 85 ms
PW: 11.6 mm — AB (ref 0.6–1.1)
RV TAPSE: 21.2 mm
TDI e' lateral: 7.72
TDI e' medial: 6.85
Weight: 2800 oz

## 2017-06-22 LAB — SURGICAL PCR SCREEN
MRSA, PCR: NEGATIVE
STAPHYLOCOCCUS AUREUS: NEGATIVE

## 2017-06-22 LAB — HIV ANTIBODY (ROUTINE TESTING W REFLEX): HIV Screen 4th Generation wRfx: NONREACTIVE

## 2017-06-22 SURGERY — OPEN REDUCTION INTERNAL FIXATION (ORIF) DISTAL FEMUR FRACTURE
Anesthesia: General | Laterality: Right

## 2017-06-22 MED ORDER — PHENYLEPHRINE HCL 10 MG/ML IJ SOLN
INTRAVENOUS | Status: DC | PRN
  Administered 2017-06-22: 25 ug/min via INTRAVENOUS

## 2017-06-22 MED ORDER — LIDOCAINE 2% (20 MG/ML) 5 ML SYRINGE
INTRAMUSCULAR | Status: AC
  Filled 2017-06-22: qty 5

## 2017-06-22 MED ORDER — DEXAMETHASONE SODIUM PHOSPHATE 10 MG/ML IJ SOLN
INTRAMUSCULAR | Status: DC | PRN
  Administered 2017-06-22: 10 mg via INTRAVENOUS

## 2017-06-22 MED ORDER — 0.9 % SODIUM CHLORIDE (POUR BTL) OPTIME
TOPICAL | Status: DC | PRN
  Administered 2017-06-22: 1000 mL
  Administered 2017-06-22: 3000 mL

## 2017-06-22 MED ORDER — DEXAMETHASONE SODIUM PHOSPHATE 10 MG/ML IJ SOLN
INTRAMUSCULAR | Status: AC
  Filled 2017-06-22: qty 1

## 2017-06-22 MED ORDER — VANCOMYCIN HCL 1000 MG IV SOLR
INTRAVENOUS | Status: AC
  Filled 2017-06-22: qty 1000

## 2017-06-22 MED ORDER — PROPOFOL 10 MG/ML IV BOLUS
INTRAVENOUS | Status: AC
  Filled 2017-06-22: qty 20

## 2017-06-22 MED ORDER — PHENYLEPHRINE 40 MCG/ML (10ML) SYRINGE FOR IV PUSH (FOR BLOOD PRESSURE SUPPORT)
PREFILLED_SYRINGE | INTRAVENOUS | Status: AC
  Filled 2017-06-22: qty 10

## 2017-06-22 MED ORDER — MIDAZOLAM HCL 2 MG/2ML IJ SOLN
INTRAMUSCULAR | Status: DC | PRN
  Administered 2017-06-22: 1 mg via INTRAVENOUS

## 2017-06-22 MED ORDER — LACTATED RINGERS IV SOLN
INTRAVENOUS | Status: DC
  Administered 2017-06-22 (×2): via INTRAVENOUS

## 2017-06-22 MED ORDER — FENTANYL CITRATE (PF) 250 MCG/5ML IJ SOLN
INTRAMUSCULAR | Status: AC
Start: 2017-06-22 — End: ?
  Filled 2017-06-22: qty 5

## 2017-06-22 MED ORDER — CEFAZOLIN SODIUM-DEXTROSE 2-4 GM/100ML-% IV SOLN
2.0000 g | INTRAVENOUS | Status: AC
  Administered 2017-06-22: 2 g via INTRAVENOUS
  Filled 2017-06-22: qty 100

## 2017-06-22 MED ORDER — BACITRACIN ZINC 500 UNIT/GM EX OINT
TOPICAL_OINTMENT | CUTANEOUS | Status: AC
  Filled 2017-06-22: qty 28.35

## 2017-06-22 MED ORDER — SUGAMMADEX SODIUM 200 MG/2ML IV SOLN
INTRAVENOUS | Status: DC | PRN
  Administered 2017-06-22: 160 mg via INTRAVENOUS

## 2017-06-22 MED ORDER — ROCURONIUM BROMIDE 50 MG/5ML IV SOSY
PREFILLED_SYRINGE | INTRAVENOUS | Status: DC | PRN
  Administered 2017-06-22: 20 mg via INTRAVENOUS
  Administered 2017-06-22: 40 mg via INTRAVENOUS
  Administered 2017-06-22: 10 mg via INTRAVENOUS

## 2017-06-22 MED ORDER — BACITRACIN ZINC 500 UNIT/GM EX OINT
TOPICAL_OINTMENT | CUTANEOUS | Status: DC | PRN
  Administered 2017-06-22: 1 via TOPICAL

## 2017-06-22 MED ORDER — PHENYLEPHRINE 40 MCG/ML (10ML) SYRINGE FOR IV PUSH (FOR BLOOD PRESSURE SUPPORT)
PREFILLED_SYRINGE | INTRAVENOUS | Status: DC | PRN
  Administered 2017-06-22: 120 ug via INTRAVENOUS
  Administered 2017-06-22: 40 ug via INTRAVENOUS
  Administered 2017-06-22: 80 ug via INTRAVENOUS

## 2017-06-22 MED ORDER — ONDANSETRON HCL 4 MG/2ML IJ SOLN: 4.0000 mg | Freq: Once | INTRAMUSCULAR | Status: DC | PRN

## 2017-06-22 MED ORDER — ONDANSETRON HCL 4 MG/2ML IJ SOLN
INTRAMUSCULAR | Status: DC | PRN
  Administered 2017-06-22: 4 mg via INTRAVENOUS

## 2017-06-22 MED ORDER — CEFAZOLIN SODIUM-DEXTROSE 2-4 GM/100ML-% IV SOLN
2.0000 g | Freq: Three times a day (TID) | INTRAVENOUS | Status: AC
  Administered 2017-06-22 – 2017-06-23 (×3): 2 g via INTRAVENOUS
  Filled 2017-06-22 (×4): qty 100

## 2017-06-22 MED ORDER — VANCOMYCIN HCL 1000 MG IV SOLR
INTRAVENOUS | Status: DC | PRN
  Administered 2017-06-22: 1000 mg

## 2017-06-22 MED ORDER — PROPOFOL 10 MG/ML IV BOLUS
INTRAVENOUS | Status: DC | PRN
  Administered 2017-06-22: 30 mg via INTRAVENOUS
  Administered 2017-06-22: 20 mg via INTRAVENOUS
  Administered 2017-06-22: 80 mg via INTRAVENOUS
  Administered 2017-06-22: 30 mg via INTRAVENOUS

## 2017-06-22 MED ORDER — FENTANYL CITRATE (PF) 100 MCG/2ML IJ SOLN: 25.0000 ug | INTRAMUSCULAR | Status: DC | PRN

## 2017-06-22 MED ORDER — SUGAMMADEX SODIUM 200 MG/2ML IV SOLN
INTRAVENOUS | Status: AC
  Filled 2017-06-22: qty 2

## 2017-06-22 MED ORDER — ONDANSETRON HCL 4 MG/2ML IJ SOLN
INTRAMUSCULAR | Status: AC
  Filled 2017-06-22: qty 2

## 2017-06-22 MED ORDER — ROCURONIUM BROMIDE 10 MG/ML (PF) SYRINGE
PREFILLED_SYRINGE | INTRAVENOUS | Status: AC
  Filled 2017-06-22: qty 5

## 2017-06-22 MED ORDER — FENTANYL CITRATE (PF) 100 MCG/2ML IJ SOLN
INTRAMUSCULAR | Status: DC | PRN
  Administered 2017-06-22: 50 ug via INTRAVENOUS
  Administered 2017-06-22: 25 ug via INTRAVENOUS
  Administered 2017-06-22: 50 ug via INTRAVENOUS

## 2017-06-22 MED ORDER — MIDAZOLAM HCL 2 MG/2ML IJ SOLN
INTRAMUSCULAR | Status: AC
  Filled 2017-06-22: qty 2

## 2017-06-22 MED ORDER — LIDOCAINE 2% (20 MG/ML) 5 ML SYRINGE
INTRAMUSCULAR | Status: DC | PRN
  Administered 2017-06-22: 40 mg via INTRAVENOUS

## 2017-06-22 SURGICAL SUPPLY — 65 items
BANDAGE ELASTIC 6 VELCRO ST LF (GAUZE/BANDAGES/DRESSINGS) IMPLANT
BIT DRILL 4.3 (BIT) ×2
BIT DRILL 4.3MM (BIT) ×1
BIT DRILL 4.3X300MM (BIT) ×1 IMPLANT
BIT DRILL LONG 3.3 (BIT) ×2 IMPLANT
BIT DRILL LONG 3.3MM (BIT) ×1
BIT DRILL QC 3.3X195 (BIT) ×3 IMPLANT
BLADE CLIPPER SURG (BLADE) IMPLANT
BNDG COHESIVE 6X5 TAN STRL LF (GAUZE/BANDAGES/DRESSINGS) ×3 IMPLANT
BNDG ELASTIC 6X10 VLCR STRL LF (GAUZE/BANDAGES/DRESSINGS) ×6 IMPLANT
BRUSH SCRUB SURG 4.25 DISP (MISCELLANEOUS) ×6 IMPLANT
CANISTER SUCT 3000ML PPV (MISCELLANEOUS) ×3 IMPLANT
CAP LOCK NCB (Cap) ×24 IMPLANT
CHLORAPREP W/TINT 26ML (MISCELLANEOUS) ×3 IMPLANT
COVER SURGICAL LIGHT HANDLE (MISCELLANEOUS) ×3 IMPLANT
DRAPE C-ARM 42X72 X-RAY (DRAPES) ×3 IMPLANT
DRAPE C-ARMOR (DRAPES) ×3 IMPLANT
DRAPE ORTHO SPLIT 77X108 STRL (DRAPES) ×4
DRAPE SURG 17X23 STRL (DRAPES) ×3 IMPLANT
DRAPE SURG ORHT 6 SPLT 77X108 (DRAPES) ×2 IMPLANT
DRAPE U-SHAPE 47X51 STRL (DRAPES) ×3 IMPLANT
DRSG ADAPTIC 3X8 NADH LF (GAUZE/BANDAGES/DRESSINGS) ×9 IMPLANT
DRSG MEPILEX BORDER 4X12 (GAUZE/BANDAGES/DRESSINGS) IMPLANT
DRSG MEPILEX BORDER 4X4 (GAUZE/BANDAGES/DRESSINGS) IMPLANT
DRSG MEPILEX BORDER 4X8 (GAUZE/BANDAGES/DRESSINGS) ×3 IMPLANT
DRSG PAD ABDOMINAL 8X10 ST (GAUZE/BANDAGES/DRESSINGS) ×12 IMPLANT
ELECT REM PT RETURN 9FT ADLT (ELECTROSURGICAL) ×3
ELECTRODE REM PT RTRN 9FT ADLT (ELECTROSURGICAL) ×1 IMPLANT
GAUZE SPONGE 4X4 12PLY STRL (GAUZE/BANDAGES/DRESSINGS) ×3 IMPLANT
GLOVE BIO SURGEON STRL SZ7.5 (GLOVE) ×12 IMPLANT
GLOVE BIOGEL PI IND STRL 7.5 (GLOVE) ×1 IMPLANT
GLOVE BIOGEL PI INDICATOR 7.5 (GLOVE) ×2
GOWN STRL REUS W/ TWL LRG LVL3 (GOWN DISPOSABLE) ×3 IMPLANT
GOWN STRL REUS W/TWL LRG LVL3 (GOWN DISPOSABLE) ×6
K-WIRE 2.0 (WIRE) ×2
K-WIRE FXSTD 280X2XNS SS (WIRE) ×1
KIT BASIN OR (CUSTOM PROCEDURE TRAY) ×3 IMPLANT
KIT ROOM TURNOVER OR (KITS) ×3 IMPLANT
KWIRE FXSTD 280X2XNS SS (WIRE) ×1 IMPLANT
NS IRRIG 1000ML POUR BTL (IV SOLUTION) ×3 IMPLANT
PACK TOTAL JOINT (CUSTOM PROCEDURE TRAY) ×3 IMPLANT
PAD ABD 8X10 STRL (GAUZE/BANDAGES/DRESSINGS) ×3 IMPLANT
PAD ARMBOARD 7.5X6 YLW CONV (MISCELLANEOUS) ×6 IMPLANT
PAD CAST 4YDX4 CTTN HI CHSV (CAST SUPPLIES) ×1 IMPLANT
PADDING CAST COTTON 4X4 STRL (CAST SUPPLIES) ×2
PADDING CAST COTTON 6X4 STRL (CAST SUPPLIES) ×3 IMPLANT
PLATE FEM DIST NCB PP 278MM (Plate) ×3 IMPLANT
SCREW 5.0 80MM (Screw) ×3 IMPLANT
SCREW CORT NCB SELFTAP 5.0X42 (Screw) ×3 IMPLANT
SCREW NCB 4.0MX38M (Screw) ×3 IMPLANT
SCREW NCB 5.0X38 (Screw) ×3 IMPLANT
SCREW NCB 5.0X85MM (Screw) ×12 IMPLANT
SPONGE LAP 18X18 X RAY DECT (DISPOSABLE) ×3 IMPLANT
STAPLER VISISTAT 35W (STAPLE) ×3 IMPLANT
SUCTION FRAZIER HANDLE 10FR (MISCELLANEOUS) ×2
SUCTION TUBE FRAZIER 10FR DISP (MISCELLANEOUS) ×1 IMPLANT
SUT ETHILON 3 0 PS 1 (SUTURE) ×15 IMPLANT
SUT MON AB 2-0 CT1 36 (SUTURE) ×6 IMPLANT
SUT VIC AB 1 CT1 27 (SUTURE) ×4
SUT VIC AB 1 CT1 27XBRD ANBCTR (SUTURE) ×2 IMPLANT
SUT VIC AB 2-0 CT1 27 (SUTURE) ×4
SUT VIC AB 2-0 CT1 TAPERPNT 27 (SUTURE) ×2 IMPLANT
TOWEL OR 17X26 10 PK STRL BLUE (TOWEL DISPOSABLE) ×6 IMPLANT
TRAY FOLEY W/METER SILVER 16FR (SET/KITS/TRAYS/PACK) IMPLANT
WATER STERILE IRR 1000ML POUR (IV SOLUTION) ×6 IMPLANT

## 2017-06-22 NOTE — Progress Notes (Signed)
  Echocardiogram 2D Echocardiogram has been performed.  Tiffany Barnes G Tiffany Barnes 06/22/2017, 10:08 AM

## 2017-06-22 NOTE — Progress Notes (Signed)
PROGRESS NOTE  Tiffany Barnes  ZOX:[id number] DOB: 31-May-1955 DOA: 06/21/2017 PCP: Barbie Banner, MD  Outpatient Specialists: Neurology, Penumalli Brief Narrative: Tiffany Barnes is a 62 y.o. female with a history of MS and chronic knee OA who presented to the ED at [facility] after falling at home. She describes an abrupt hot sensation rising into her face followed by lightheadedness and reported LOC, woke up on the ground with right leg pain above the knee where she had landed. She has been having very similar episodes, specifically while cooking over the stove, but usually can make it to seated position prior to passing out, improved with cool cloth. No seizure-like activity reported. No chest pain, dyspnea, palpitations.   Vitals were stable. Blood work showed erosive 13.4 K, hemoglobin of 11.8, normal chemistry. EKG shows sinus rhythm at 60 with incomplete LBBB (new since last EKG in the system from 2014) An x-ray of the hip was normal. X-ray of the knee showed mildly comminuted distal femoral shaft fracture with mild displacement. Chest x-ray was unremarkable. She was found to have a laceration over the anterior knee which was sutured by ED physician and given a dose of IV Ancef. Orthopedic surgery was consulted and recommended transfer to [facility] for repair 1/29 by Dr. Jena Gauss. Echocardiogram was performed showing preserved biventricular function with grade 1 diastolic dysfunction, no wall motion abnormalities or significant valvular disease.   Assessment & Plan: Principal Problem:   Fracture of femoral shaft, right, closed (HCC) Active Problems:   Syncope, vasovagal   Other fracture of right femur, initial encounter for closed fracture (HCC)   Femur fracture, right (HCC)  Right supracondylar femoral fracture:  - Dr. Jena Gauss to operate 1/29. No further cardiac evaluation planned as echocardiogram appears benign. - continue pain control as ordered.   Chronic diastolic CHF and incomplete LBBB:  Euvolemic at this time. EF 60-65%, G1DD, no WMA's.  - Control BP, no further perioperative work up indicated  Syncope: History most consistent with vasovagal etiology, possibly triggered by heat w/MS. No valvular abnormalities to blame on echo. - Continue telemetry monitoring  Multiple sclerosis: Since the 1990's. Symptoms stable.  - Continue outpatient follow up with Dr. Marjory Lies  HTN: Chronic, stable.  - Continue losartan - Monitor BMP postop.  GERD: Chronic, stable - Ok to continue PPI  DVT prophylaxis: Lovenox Code Status: Full Family Communication: Husband at bedside Disposition Plan: Uncertain, will get therapy evaluations postop.  Consultants:   Orthopedics, Dr. Jena Gauss  Procedures:   Echocardiogram 06/22/2017: - Left ventricle: The cavity size was normal. There was mild   concentric hypertrophy. Systolic function was normal. The   estimated ejection fraction was in the range of 60% to 65%. Wall   motion was normal; there were no regional wall motion   abnormalities. Doppler parameters are consistent with abnormal   left ventricular relaxation (grade 1 diastolic dysfunction).   Doppler parameters are consistent with indeterminate ventricular   filling pressure. - Aortic valve: Transvalvular velocity was within the normal range.   There was no stenosis. There was mild regurgitation. - Mitral valve: Transvalvular velocity was within the normal range.   There was no evidence for stenosis. There was trivial   regurgitation. - Right ventricle: The cavity size was normal. Wall thickness was   normal. Systolic function was normal. - Atrial septum: No defect or patent foramen ovale was identified   by color flow Doppler. - Tricuspid valve: There was no regurgitation.  Antimicrobials:  Ancef 1/28 >>  Subjective: Pain is controlled in knee immobilizer. No further syncopal episodes. Denies recent DOE or chest pain or palpitations.   Objective: Vitals:   06/21/17  1945 06/21/17 2100 06/22/17 0530 06/22/17 1211  BP:  (!) 154/61 122/61   Pulse:  82 72   Resp: 19 18    Temp:  98.9 F (37.2 C) 99 F (37.2 C)   TempSrc:  Oral Oral   SpO2:  94% 95%   Weight:    79.4 kg (174 lb 16 oz)  Height:    5\' 5"  (1.651 m)    Intake/Output Summary (Last 24 hours) at 06/22/2017 1408 Last data filed at 06/22/2017 0700 Gross per 24 hour  Intake 1023.75 ml  Output 100 ml  Net 923.75 ml   Filed Weights   06/21/17 0933 06/22/17 1211  Weight: 79.4 kg (175 lb) 79.4 kg (174 lb 16 oz)    Gen: 62 y.o. female in no distress Pulm: Non-labored breathing room air. Clear to auscultation bilaterally.  CV: Regular rate and rhythm. No murmur, rub, or gallop. No JVD, no pedal edema. GI: Abdomen soft, non-tender, non-distended, with normoactive bowel sounds. No organomegaly or masses felt. Ext: Right knee immobilizer in place, not taken off prior to surgery today. RLE with brisk cap refill, intact motor function and sensation distally and proximally.  Skin: As above Neuro: Alert and oriented. Diffuse extremity weakness without new focal neurological deficits. Psych: Judgement and insight appear normal. Mood & affect appropriate.   Data Reviewed: I have personally reviewed following labs and imaging studies  CBC: Recent Labs  Lab 06/21/17 1051  WBC 13.4*  NEUTROABS 12.4*  HGB 11.8*  HCT 37.5  MCV 90.1  PLT 243   Basic Metabolic Panel: Recent Labs  Lab 06/21/17 1051  NA 141  K 3.7  CL 104  CO2 27  GLUCOSE 122*  BUN 23*  CREATININE 0.60  CALCIUM 9.0  MG 1.6*   GFR: Estimated Creatinine Clearance: 76 mL/min (by C-G formula based on SCr of 0.6 mg/dL). Liver Function Tests: Recent Labs  Lab 06/21/17 1051  AST 25  ALT 19  ALKPHOS 117  BILITOT 0.8  PROT 6.2*  ALBUMIN 3.5   Recent Labs  Lab 06/21/17 1051  LIPASE 27   No results for input(s): AMMONIA in the last 168 hours. Coagulation Profile: No results for input(s): INR, PROTIME in the last 168  hours. Cardiac Enzymes: No results for input(s): CKTOTAL, CKMB, CKMBINDEX, TROPONINI in the last 168 hours. BNP (last 3 results) No results for input(s): PROBNP in the last 8760 hours. HbA1C: No results for input(s): HGBA1C in the last 72 hours. CBG: Recent Labs  Lab 06/21/17 0936  GLUCAP 97   Lipid Profile: No results for input(s): CHOL, HDL, LDLCALC, TRIG, CHOLHDL, LDLDIRECT in the last 72 hours. Thyroid Function Tests: No results for input(s): TSH, T4TOTAL, FREET4, T3FREE, THYROIDAB in the last 72 hours. Anemia Panel: No results for input(s): VITAMINB12, FOLATE, FERRITIN, TIBC, IRON, RETICCTPCT in the last 72 hours. Urine analysis: No results found for: COLORURINE, APPEARANCEUR, LABSPEC, PHURINE, GLUCOSEU, HGBUR, BILIRUBINUR, KETONESUR, PROTEINUR, UROBILINOGEN, NITRITE, LEUKOCYTESUR Recent Results (from the past 240 hour(s))  Surgical pcr screen     Status: None   Collection Time: 06/22/17 12:51 AM  Result Value Ref Range Status   MRSA, PCR NEGATIVE NEGATIVE Final   Staphylococcus aureus NEGATIVE NEGATIVE Final    Comment: (NOTE) The Xpert SA Assay (FDA approved for NASAL specimens in patients 29 years of age and older), is one  component of a comprehensive surveillance program. It is not intended to diagnose infection nor to guide or monitor treatment.       Radiology Studies: Dg Chest 1 View  Result Date: 06/21/2017 CLINICAL DATA:  Syncope EXAM: CHEST 1 VIEW COMPARISON:  09/09/2007 FINDINGS: Lungs are clear.  No pleural effusion or pneumothorax. The heart is normal in size. Cholecystectomy clips. IMPRESSION: No active disease. Electronically Signed   By: Charline Bills M.D.   On: 06/21/2017 11:52   Dg Knee 2 Views Right  Result Date: 06/21/2017 CLINICAL DATA:  Fall, right leg pain EXAM: RIGHT KNEE - 1-2 VIEW COMPARISON:  None. FINDINGS: Mildly comminuted distal femoral shaft fracture. Approximately 1/2 shaft width posterior/lateral displacement of the knee relative to  the femoral shaft. Suspected suprapatellar effusion/hemorrhage. Mild to moderate tricompartmental degenerative changes. IMPRESSION: Mildly comminuted distal femoral shaft fracture with mild displacement, as above. Electronically Signed   By: Charline Bills M.D.   On: 06/21/2017 11:52   Ct Knee Right Wo Contrast  Result Date: 06/21/2017 CLINICAL DATA:  Syncopal episode.  Fell.  Evaluate fractured femur. EXAM: CT OF THE right KNEE WITHOUT CONTRAST TECHNIQUE: Multidetector CT imaging of the right knee was performed according to the standard protocol. Multiplanar CT image reconstructions were also generated. COMPARISON:  Radiographs 06/21/2017 FINDINGS: Complex comminuted displaced supracondylar femur fracture with 1 shaft width of posterior and medial displacement. Associated lipohemarthrosis with some areas of fatty marrow in the joint. The tibia is intact. No fibula or patella fractures. Patella Alta and wavy patellar tendon but I do not see an obvious ruptured quadriceps tendon. Air in the subcutaneous tissues may suggest an open fracture. Severe degenerative changes involving the knee joint with joint space narrowing, spurring and subchondral cystic change. This is most pronounced in the lateral compartment. IMPRESSION: Complex comminuted displaced supracondylar femur fracture as described above. No acute fracture involving the tibia, fibula or patella. Electronically Signed   By: Rudie Meyer M.D.   On: 06/21/2017 15:54   Dg Hip Unilat W Or Wo Pelvis 2-3 Views Right  Result Date: 06/21/2017 CLINICAL DATA:  Right hip pain after fall today. EXAM: DG HIP (WITH OR WITHOUT PELVIS) 2-3V RIGHT COMPARISON:  None. FINDINGS: There is no evidence of hip fracture or dislocation. There is no evidence of arthropathy or other focal bone abnormality. IMPRESSION: Normal right hip. Electronically Signed   By: Lupita Raider, M.D.   On: 06/21/2017 11:52    Scheduled Meds: . [MAR Hold] aspirin  81 mg Oral Daily  .  [MAR Hold] cholecalciferol  1,000 Units Oral QHS  . [MAR Hold] enoxaparin (LOVENOX) injection  40 mg Subcutaneous Q24H  . [MAR Hold] Fingolimod HCl  0.5 mg Oral Daily  . [MAR Hold] losartan  50 mg Oral Daily  . [MAR Hold] pantoprazole  40 mg Oral Daily  . [MAR Hold] polyethylene glycol  17 g Oral Daily   Continuous Infusions: . sodium chloride 75 mL/hr at 06/21/17 1841  . [MAR Hold]  ceFAZolin (ANCEF) IV 1 g (06/22/17 0457)  . [MAR Hold]  ceFAZolin (ANCEF) IV    . lactated ringers 10 mL/hr at 06/22/17 1217  . [MAR Hold] methocarbamol (ROBAXIN)  IV       LOS: 1 day   Time spent: 25 minutes.  Hazeline Junker, MD [facility] Pager [phone]  If 7PM-7AM, please contact night-coverage [email or web address] Password [facility] 06/22/2017, 2:08 PM

## 2017-06-22 NOTE — Anesthesia Procedure Notes (Signed)
Procedure Name: Intubation Date/Time: 06/22/2017 1:52 PM Performed by: Pearson Grippe, CRNA Pre-anesthesia Checklist: Patient identified, Emergency Drugs available, Suction available and Patient being monitored Patient Re-evaluated:Patient Re-evaluated prior to induction Oxygen Delivery Method: Circle system utilized Preoxygenation: Pre-oxygenation with 100% oxygen Induction Type: IV induction Ventilation: Mask ventilation without difficulty Laryngoscope Size: Miller and 2 Grade View: Grade I Tube type: Oral Tube size: 7.0 mm Number of attempts: 1 Airway Equipment and Method: Stylet and Oral airway Placement Confirmation: ETT inserted through vocal cords under direct vision,  positive ETCO2 and breath sounds checked- equal and bilateral Secured at: 20 cm Tube secured with: Tape Dental Injury: Teeth and Oropharynx as per pre-operative assessment

## 2017-06-22 NOTE — Anesthesia Preprocedure Evaluation (Addendum)
Anesthesia Evaluation  Patient identified by MRN, date of birth, ID band Patient awake    Reviewed: Allergy & Precautions, NPO status , Patient's Chart, lab work & pertinent test results  Airway Mallampati: II  TM Distance: >3 FB Neck ROM: Full    Dental  (+) Teeth Intact, Dental Advisory Given   Pulmonary neg pulmonary ROS,    Pulmonary exam normal breath sounds clear to auscultation       Cardiovascular hypertension, Pt. on medications + dysrhythmias Atrial Fibrillation  Rhythm:Irregular Rate:Normal  EKG 06/22/17: A-fib, HR 60  Echo 06/22/17: Study Conclusions  - Left ventricle: The cavity size was normal. There was mild concentric hypertrophy. Systolic function was normal. The estimated ejection fraction was in the range of 60% to 65%. Wall motion was normal; there were no regional wall motion abnormalities. Doppler parameters are consistent with abnormal left ventricular relaxation (grade 1 diastolic dysfunction). Doppler parameters are consistent with indeterminate ventricular filling pressure. - Aortic valve: Transvalvular velocity was within the normal range. There was no stenosis. There was mild regurgitation. - Mitral valve: Transvalvular velocity was within the normal range. There was no evidence for stenosis. There was trivial regurgitation. - Right ventricle: The cavity size was normal. Wall thickness was normal. Systolic function was normal. - Atrial septum: No defect or patent foramen ovale was identified by color flow Doppler. - Tricuspid valve: There was no regurgitation.   Neuro/Psych MS negative psych ROS   GI/Hepatic Neg liver ROS, GERD  Medicated,  Endo/Other  negative endocrine ROS  Renal/GU negative Renal ROS     Musculoskeletal  (+) Arthritis , Osteoarthritis,    Abdominal   Peds  Hematology  (+) Blood dyscrasia, anemia ,   Anesthesia Other Findings Day of surgery medications reviewed with the  patient.  Reproductive/Obstetrics                           Anesthesia Physical Anesthesia Plan  ASA: III  Anesthesia Plan: General   Post-op Pain Management:    Induction: Intravenous  PONV Risk Score and Plan: 3 and Ondansetron, Dexamethasone and Midazolam  Airway Management Planned: Oral ETT  Additional Equipment:   Intra-op Plan:   Post-operative Plan: Extubation in OR  Informed Consent: I have reviewed the patients History and Physical, chart, labs and discussed the procedure including the risks, benefits and alternatives for the proposed anesthesia with the patient or authorized representative who has indicated his/her understanding and acceptance.   Dental advisory given  Plan Discussed with: CRNA  Anesthesia Plan Comments: (Risks/benefits of general anesthesia discussed with patient including risk of damage to teeth, lips, gum, and tongue, nausea/vomiting, allergic reactions to medications, and the possibility of heart attack, stroke and death.  All patient questions answered.  Patient wishes to proceed.)        Anesthesia Quick Evaluation
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
## 2017-06-22 NOTE — Progress Notes (Signed)
Initial Nutrition Assessment  DOCUMENTATION CODES:   Not applicable  INTERVENTION:   Will monitor for PO diet advancement and add nutrition supplements as needed.   NUTRITION DIAGNOSIS:   Increased nutrient needs related to post-op healing as evidenced by estimated needs.   GOAL:   Patient will meet greater than or equal to 90% of their needs   MONITOR:   Diet advancement, PO intake, Supplement acceptance, Labs, Weight trends, Skin  REASON FOR ASSESSMENT:   Consult Assessment of nutrition requirement/status  ASSESSMENT:   62 yo female with PMH Multiple Sclerosis, DVT, HTN admitted 1/28 with femur fracture from syncopal episode  Pt leaving for surgery upon arrival for nutrition assessment.  Pt energy and protein needs will be increased due to post-operative healing.  Recommend supplementation as appropriate when pt is no longer NPO.  No nutrition problems identified PTA.   NFPE not applicable due to history of muscle loss (I.e. Multiple sclerosis)  Diet Order:  Diet NPO time specified  EDUCATION NEEDS:   No education needs have been identified at this time  Skin:  Skin Assessment: Reviewed RN Assessment  Last BM:  1/27  Height:   Ht Readings from Last 1 Encounters:  06/22/17 5\' 5"  (1.651 m)    Weight:   Wt Readings from Last 1 Encounters:  06/22/17 174 lb 16 oz (79.4 kg)    Ideal Body Weight:     BMI:  Body mass index is 29.12 kg/m.  Estimated Nutritional Needs:   Kcal:  1,600-1,800 kcal  Protein:  90-110 grams  Fluid:  1.6-1.8L    Marjie Skiff, MS Dietetic Intern Pager: [phone]

## 2017-06-22 NOTE — Anesthesia Postprocedure Evaluation (Signed)
Anesthesia Post Note  Patient: TEMAKA [facility]  Procedure(s) Performed: OPEN REDUCTION INTERNAL FIXATION (ORIF) RIGHT DISTAL FEMUR FRACTURE (Right )     Patient location during evaluation: PACU Anesthesia Type: General Level of consciousness: awake and alert Pain management: pain level controlled Vital Signs Assessment: post-procedure vital signs reviewed and stable Respiratory status: spontaneous breathing, nonlabored ventilation and respiratory function stable Cardiovascular status: blood pressure returned to baseline and stable Postop Assessment: no apparent nausea or vomiting Anesthetic complications: no    Last Vitals:  Vitals:   06/22/17 1650 06/22/17 1705  BP: 132/72 (!) 142/62  Pulse: 75 65  Resp: 13 11  Temp:  36.7 C  SpO2: 96% 96%    Last Pain:  Vitals:   06/22/17 1114  TempSrc:   PainSc: 3                  Aldonia Keeven,W. EDMOND

## 2017-06-22 NOTE — Transfer of Care (Signed)
Immediate Anesthesia Transfer of Care Note  Patient: Tiffany Barnes  Procedure(s) Performed: OPEN REDUCTION INTERNAL FIXATION (ORIF) RIGHT DISTAL FEMUR FRACTURE (Right )  Patient Location: PACU  Anesthesia Type:General  Level of Consciousness: awake, alert , oriented, patient cooperative and responds to stimulation  Airway & Oxygen Therapy: Patient Spontanous Breathing  Post-op Assessment: Report given to RN and Post -op Vital signs reviewed and stable  Post vital signs: Reviewed and stable  Last Vitals:  Vitals:   06/21/17 2100 06/22/17 0530  BP: (!) 154/61 122/61  Pulse: 82 72  Resp: 18   Temp: 37.2 C 37.2 C  SpO2: 94% 95%    Last Pain:  Vitals:   06/22/17 1114  TempSrc:   PainSc: 3       Patients Stated Pain Goal: 2 (06/22/17 1015)  Complications: No apparent anesthesia complications

## 2017-06-22 NOTE — Op Note (Signed)
OrthopaedicSurgeryOperativeNote [phone]) Date of Surgery: 06/22/2017  Admit Date: 06/21/2017   Diagnoses: Pre-Op Diagnoses: Right extra-articular distal femur fracture Right knee laceration  Post-Op Diagnosis: Same  Procedures: 1. CPT 27511-ORIF supracondylar right distal femur fracture 2. CPT 12032-Intermediate repair knee laceration   Surgeons: Primary: Josiyah Tozzi, Gillie Manners, MD   Location:[facility] OR ROOM 11   AnesthesiaGeneral   Antibiotics:Ancef 2g preop   Tourniquettime:None  EstimatedBloodLoss:200 mL   Complications:None  Specimens:None  Implants: Implant Name Type Inv. Item Serial No. Manufacturer Lot No. LRB No. Used Action  PLATE FEM DIST NCB PP - [id number] Plate PLATE FEM DIST NCB PP  [employer]  Right 1 Implanted  SCREW NCB 5.0X85MM - [id number] Screw SCREW NCB 5.0X85MM  [employer]  Right 4 Implanted  SCREW 5.0 - [id number] Screw SCREW 5.0  [employer]  Right 1 Implanted  SCREW CORT NCB SELFTAP 5.0X42 - [id number] Screw SCREW CORT NCB SELFTAP 5.0X42  [employer]  Right 1 Implanted  SCREW NCB 5.0X38 - [id number] Screw SCREW NCB 5.0X38  [employer]  Right 1 Implanted  CAP LOCK NCB - [id number] Cap CAP LOCK NCB  [employer]  Right 8 Implanted  SCREW NCB 4.0MX38M - [id number] Screw SCREW NCB 4.0MX38M  [employer]  Right 1 Implanted    IndicationsforSurgery: 62 year old female with MS and end-stage knee arthritis presented with distal femur fracture after a fall. I felt due to the displacement operative treatment was warranted. Risks discussed included bleeding requiring blood transfusion, bleeding causing a hematoma, infection, malunion, nonunion, damage to surrounding nerves and blood vessels, pain, hardware prominence or irritation, hardware failure, stiffness, post-traumatic arthritis, DVT/PE, compartment syndrome, and even death. Risks and benefits were extensively discussed as noted above and the patient and their family agreed to proceed  with surgery and consent was obtained.  Operative Findings: 1. Irrigation and debridement and closure of 3 cm laceration over patella (not open fracture) 2. ORIF right supracondylar femur fracture with [employer] NCB 12 hole distal femoral locking plate.  Procedure: The patient was identified in the preoperative holding area. Consent was confirmed with the patient and their family and all questions were answered. The operative extremity was marked after confirmation with the patient. The patientwas then brought back to the operating room by our anesthesia colleagues. The patient was placed under general anesthesia and was carefully transferred over to a radiolucent flattop table. They were secured to the bed and all bony prominences were padded.  A bump was placed under the operative hip. The operative extremity was then prepped and draped in usual sterile fashion. A timeout was performed to verify the patient, the procedure and extremity. Preoperative antibiotics were dosed.  I first started by extending the laceration present over the anterior patella. I extended it proximally and distally. The initial size was 3 cm. I sharply excised the skin edges and subcutaneous fat with a 10 blade. I then used low pressure pulsatile lavage to irrigate the wound with nearly 3L of normal saline. I changed my gloves and proceeded to the fixation portion of the procedure.  Fluoroscopy was used to identify the fracture. A lateral approach was made to the distal femur. It was taken down through the skin and the IT band was incised in line with the incision. The distal portion of the vastus lateralis was reflected off the IT band and the distal articular block of the lateral femoral condyle was exposed. Subperiosteal dissection was carried to the edges of the articular  surface of the knee. The fracture was palpated but  the soft tissue sleeve was maintained. A reduction maneuver was performed to align the fracture in  the AP and lateral planes. Once the fracture was reduced.  I used fluoroscopy to identify the correct length plate. I chose a 12-hole plate. The aiming arm was attached to the plate and slide submuscularly under the vastus to the proximal femur. A K-wire was used to appropriately align the distal portion of the plate. A drill sleeve was used to provisionally fix the proximal portion of the plate with 1.6XW drill bit. A perfect lateral was obtained to show appropriate positioning of the plate.  The distal segment was drilled and 5.55mm cortical screws were placed in the distal fracture segment. A total of 3 screws were placed. A 5.66mm cortical screws was placed in the femoral shaft to fine tune the reduction of the fracture. Another 5.23mm shaft screw was placed in the femoral shaft and the 3.66mm drill bit was exchanged for a 4.34mm screw to prevent a stress riser. These were all placed through the targeting guide with percutaneous incisions. Once all of the screws were in place in the femoral shaft, locking caps were placed over to the distal screws in the shaft. The targeting guide was removed. Two more screws were placed in the distal segment. Fluoroscopy was used to confirm adequate reduction of the fracture and appropriate position of the plate and screws and locking caps were placed on the distal screws.  The incisions were irrigated with normal saline. A gram of vancomycin powder was placed in the incision around the plate. The IT band was closed with #1 vicryl. The skin was closed with 2-0vicryl, 3-0 nylon. The incisions were dressed and the leg was wrapped with ACE wraps. The patient was then transferred to the regular floor bed and taken to the PACU in stable condition.  Post Op Plan/Instructions: The patient will be weight bearing as tolerated. She will receive Ancef postoperatively. I will recommend lovenox for VTE prophylaxis.  I was present and performed the entire surgery.  Truitt Merle,  MD [facility]

## 2017-06-23 ENCOUNTER — Encounter (HOSPITAL_COMMUNITY): Payer: Self-pay | Admitting: Student

## 2017-06-23 DIAGNOSIS — M1711 Unilateral primary osteoarthritis, right knee: Secondary | ICD-10-CM

## 2017-06-23 DIAGNOSIS — D62 Acute posthemorrhagic anemia: Secondary | ICD-10-CM

## 2017-06-23 LAB — CBC
HCT: 25.6 % — ABNORMAL LOW (ref 36.0–46.0)
Hemoglobin: 8.3 g/dL — ABNORMAL LOW (ref 12.0–15.0)
MCH: 28.8 pg (ref 26.0–34.0)
MCHC: 32.4 g/dL (ref 30.0–36.0)
MCV: 88.9 fL (ref 78.0–100.0)
PLATELETS: 174 10*3/uL (ref 150–400)
RBC: 2.88 MIL/uL — ABNORMAL LOW (ref 3.87–5.11)
RDW: 13.7 % (ref 11.5–15.5)
WBC: 7.2 10*3/uL (ref 4.0–10.5)

## 2017-06-23 LAB — ABO/RH: ABO/RH(D): A POS

## 2017-06-23 LAB — BASIC METABOLIC PANEL
Anion gap: 9 (ref 5–15)
BUN: 24 mg/dL — AB (ref 6–20)
CO2: 23 mmol/L (ref 22–32)
CREATININE: 0.68 mg/dL (ref 0.44–1.00)
Calcium: 8.2 mg/dL — ABNORMAL LOW (ref 8.9–10.3)
Chloride: 109 mmol/L (ref 101–111)
GFR calc Af Amer: >60 mL/min (ref >60)
GLUCOSE: 120 mg/dL — AB (ref 65–99)
Potassium: 3.9 mmol/L (ref 3.5–5.1)
Sodium: 141 mmol/L (ref 135–145)

## 2017-06-23 LAB — PREPARE RBC (CROSSMATCH)

## 2017-06-23 MED ORDER — MAGNESIUM SULFATE 2 GM/50ML IV SOLN
2.0000 g | Freq: Once | INTRAVENOUS | Status: AC
  Administered 2017-06-23: 2 g via INTRAVENOUS
  Filled 2017-06-23: qty 50

## 2017-06-23 MED ORDER — SENNOSIDES-DOCUSATE SODIUM 8.6-50 MG PO TABS
1.0000 | ORAL_TABLET | Freq: Two times a day (BID) | ORAL | Status: DC
  Administered 2017-06-23 – 2017-06-25 (×6): 1 via ORAL
  Filled 2017-06-23 (×8): qty 1

## 2017-06-23 MED ORDER — SODIUM CHLORIDE 0.9 % IV SOLN
Freq: Once | INTRAVENOUS | Status: AC
  Administered 2017-06-23: 17:00:00 via INTRAVENOUS

## 2017-06-23 NOTE — Progress Notes (Signed)
PROGRESS NOTE    Tiffany Barnes  JQD:[id number] DOB: Oct 18, 1955 DOA: 06/21/2017 PCP: Barbie Banner, MD    Brief Narrative:  Tiffany Barnes is a 62 y.o. female with a history of MS and chronic knee OA who presented to the ED at [facility] after falling at home. She describes an abrupt hot sensation rising into her face followed by lightheadedness and reported LOC, woke up on the ground with right leg pain above the knee where she had landed. She has been having very similar episodes, specifically while cooking over the stove, but usually can make it to seated position prior to passing out, improved with cool cloth. No seizure-like activity reported. No chest pain, dyspnea, palpitations.   Vitals were stable. Blood work showed erosive 13.4 K, hemoglobin of 11.8, normal chemistry. EKG shows sinus rhythm at 60 with incomplete LBBB (new since last EKG in the system from 2014) An x-ray of the hip was normal. X-ray of the knee showed mildly comminuted distal femoral shaft fracture with mild displacement. Chest x-ray was unremarkable. She was found to have a laceration over the anterior knee which was sutured by ED physician and given a dose of IV Ancef. Orthopedic surgery was consulted and recommended transfer to [facility] for repair 1/29 by Dr. Jena Gauss. Echocardiogram was performed showing preserved biventricular function with grade 1 diastolic dysfunction, no wall motion abnormalities or significant valvular disease.     Assessment & Plan:   Principal Problem:   Fracture of femoral shaft, right, closed (HCC) Active Problems:   Syncope, vasovagal   Closed comminuted supracondylar fracture of femur, right, initial encounter (HCC)   Femur fracture, right (HCC)   Degenerative arthritis of right knee  1-Right supracondylar femoral fracture:  - Dr. Jena Gauss S/P ; ORIF supracondylar right distal femur fracture -Intermediate repair knee laceration  -Pt per ortho.  -Bowel regimen, DVT prophylaxis per ortho.    2-Acute Blood loss anemia;  Post sx expected.  Hb drop from 11 to 8.  Will transfuse one unit PRBC>   3-Syncope;  ECHO normal EF, no wall motion abnormalities.  Needs follow up with cardiology for consideration for holter.  EKG today sinus rhythm.   Hypomagnesemia; replete IV.   Chronic diastolic CHF and incomplete LBBB: Euvolemic at this time. EF 60-65%, G1DD, no WMA's.   Multiple sclerosis: Since the 1990's. Symptoms stable.  - Continue outpatient follow up with Dr. Marjory Lies  HTN: Chronic, stable.  - Continue losartan - Monitor BMP postop.  GERD: Chronic, stable - Ok to continue PPI    DVT prophylaxis: Lovenox Code Status: full code.  Family Communication: husband who was at bedside.  Disposition Plan: to be determine.   Consultants:   Ortho    Procedures:  Echocardiogram 06/22/2017: - Left ventricle: The cavity size was normal. There was mild concentric hypertrophy. Systolic function was normal. The estimated ejection fraction was in the range of 60% to 65%. Wall motion was normal; there were no regional wall motion abnormalities. Doppler parameters are consistent with abnormal left ventricular relaxation (grade 1 diastolic dysfunction). Doppler parameters are consistent with indeterminate ventricular filling pressure. - Aortic valve: Transvalvular velocity was within the normal range. There was no stenosis. There was mild regurgitation. - Mitral valve: Transvalvular velocity was within the normal range. There was no evidence for stenosis. There was trivial regurgitation. - Right ventricle: The cavity size was normal. Wall thickness was normal. Systolic function was normal. - Atrial septum: No defect or patent foramen ovale was identified by  color flow Doppler. - Tricuspid valve: There was no regurgitation.       Antimicrobials:  Ancef pre op/    Subjective: She is feeling ok, denies significant leg pain.     Objective: Vitals:   06/23/17 0025 06/23/17 0503 06/23/17 1415 06/23/17 1433  BP: 137/72 135/61 127/74 129/76  Pulse: 72 67 80 73  Resp: 16 16 18 18   Temp: 97.9 F (36.6 C) 98.8 F (37.1 C) 98.7 F (37.1 C) 98 F (36.7 C)  TempSrc: Oral Oral Oral Oral  SpO2: 97% 97% 100% 100%  Weight:      Height:        Intake/Output Summary (Last 24 hours) at 06/23/2017 1452 Last data filed at 06/23/2017 0900 Gross per 24 hour  Intake 2915 ml  Output 1450 ml  Net 1465 ml   Filed Weights   06/21/17 0933 06/22/17 1211  Weight: 79.4 kg (175 lb) 79.4 kg (174 lb 16 oz)    Examination:  General exam: Appears calm and comfortable  Respiratory system: Clear to auscultation. Respiratory effort normal. Cardiovascular system: S1 & S2 heard, RRR. No JVD, murmurs, rubs, gallops or clicks. No pedal edema. Gastrointestinal system: Abdomen is nondistended, soft and nontender. No organomegaly or masses felt. Normal bowel sounds heard. Central nervous system: Alert and oriented. No focal neurological deficits. Extremities: right LE with clean dressing.  Skin: No rashes, lesions or ulcers   Data Reviewed: I have personally reviewed following labs and imaging studies  CBC: Recent Labs  Lab 06/21/17 1051 06/23/17 0706  WBC 13.4* 7.2  NEUTROABS 12.4*  --   HGB 11.8* 8.3*  HCT 37.5 25.6*  MCV 90.1 88.9  PLT 243 174   Basic Metabolic Panel: Recent Labs  Lab 06/21/17 1051 06/23/17 0706  NA 141 141  K 3.7 3.9  CL 104 109  CO2 27 23  GLUCOSE 122* 120*  BUN 23* 24*  CREATININE 0.60 0.68  CALCIUM 9.0 8.2*  MG 1.6*  --    GFR: Estimated Creatinine Clearance: 76 mL/min (by C-G formula based on SCr of 0.68 mg/dL). Liver Function Tests: Recent Labs  Lab 06/21/17 1051  AST 25  ALT 19  ALKPHOS 117  BILITOT 0.8  PROT 6.2*  ALBUMIN 3.5   Recent Labs  Lab 06/21/17 1051  LIPASE 27   No results for input(s): AMMONIA in the last 168 hours. Coagulation Profile: No results for  input(s): INR, PROTIME in the last 168 hours. Cardiac Enzymes: No results for input(s): CKTOTAL, CKMB, CKMBINDEX, TROPONINI in the last 168 hours. BNP (last 3 results) No results for input(s): PROBNP in the last 8760 hours. HbA1C: No results for input(s): HGBA1C in the last 72 hours. CBG: Recent Labs  Lab 06/21/17 0936  GLUCAP 97   Lipid Profile: No results for input(s): CHOL, HDL, LDLCALC, TRIG, CHOLHDL, LDLDIRECT in the last 72 hours. Thyroid Function Tests: No results for input(s): TSH, T4TOTAL, FREET4, T3FREE, THYROIDAB in the last 72 hours. Anemia Panel: No results for input(s): VITAMINB12, FOLATE, FERRITIN, TIBC, IRON, RETICCTPCT in the last 72 hours. Sepsis Labs: No results for input(s): PROCALCITON, LATICACIDVEN in the last 168 hours.  Recent Results (from the past 240 hour(s))  Surgical pcr screen     Status: None   Collection Time: 06/22/17 12:51 AM  Result Value Ref Range Status   MRSA, PCR NEGATIVE NEGATIVE Final   Staphylococcus aureus NEGATIVE NEGATIVE Final    Comment: (NOTE) The Xpert SA Assay (FDA approved for NASAL specimens in patients 22  years of age and older), is one component of a comprehensive surveillance program. It is not intended to diagnose infection nor to guide or monitor treatment.          Radiology Studies: Ct Knee Right Wo Contrast  Result Date: 06/21/2017 CLINICAL DATA:  Syncopal episode.  Fell.  Evaluate fractured femur. EXAM: CT OF THE right KNEE WITHOUT CONTRAST TECHNIQUE: Multidetector CT imaging of the right knee was performed according to the standard protocol. Multiplanar CT image reconstructions were also generated. COMPARISON:  Radiographs 06/21/2017 FINDINGS: Complex comminuted displaced supracondylar femur fracture with 1 shaft width of posterior and medial displacement. Associated lipohemarthrosis with some areas of fatty marrow in the joint. The tibia is intact. No fibula or patella fractures. Patella Alta and wavy patellar  tendon but I do not see an obvious ruptured quadriceps tendon. Air in the subcutaneous tissues may suggest an open fracture. Severe degenerative changes involving the knee joint with joint space narrowing, spurring and subchondral cystic change. This is most pronounced in the lateral compartment. IMPRESSION: Complex comminuted displaced supracondylar femur fracture as described above. No acute fracture involving the tibia, fibula or patella. Electronically Signed   By: Rudie Meyer M.D.   On: 06/21/2017 15:54   Dg Knee Complete 4 Views Right  Result Date: 06/22/2017 CLINICAL DATA:  Status post open reduction and internal fixation of distal right femur. EXAM: DG C-ARM 61-120 MIN; RIGHT KNEE - COMPLETE 4+ VIEW FLUOROSCOPY TIME:  1 minute 30 seconds. COMPARISON:  Radiographs of June 21, 2017. FINDINGS: Six intraoperative fluoroscopic images of the right femur and knee demonstrate surgical internal fixation of distal right femoral fracture. Good alignment of fracture components is noted. IMPRESSION: Status post surgical internal fixation of distal right femoral fracture with good alignment of fracture components. Electronically Signed   By: Lupita Raider, M.D.   On: 06/22/2017 15:55   Dg C-arm 1-60 Min  Result Date: 06/22/2017 CLINICAL DATA:  Status post open reduction and internal fixation of distal right femur. EXAM: DG C-ARM 61-120 MIN; RIGHT KNEE - COMPLETE 4+ VIEW FLUOROSCOPY TIME:  1 minute 30 seconds. COMPARISON:  Radiographs of June 21, 2017. FINDINGS: Six intraoperative fluoroscopic images of the right femur and knee demonstrate surgical internal fixation of distal right femoral fracture. Good alignment of fracture components is noted. IMPRESSION: Status post surgical internal fixation of distal right femoral fracture with good alignment of fracture components. Electronically Signed   By: Lupita Raider, M.D.   On: 06/22/2017 15:55   Dg C-arm 1-60 Min  Result Date: 06/22/2017 CLINICAL  DATA:  Status post open reduction and internal fixation of distal right femur. EXAM: DG C-ARM 61-120 MIN; RIGHT KNEE - COMPLETE 4+ VIEW FLUOROSCOPY TIME:  1 minute 30 seconds. COMPARISON:  Radiographs of June 21, 2017. FINDINGS: Six intraoperative fluoroscopic images of the right femur and knee demonstrate surgical internal fixation of distal right femoral fracture. Good alignment of fracture components is noted. IMPRESSION: Status post surgical internal fixation of distal right femoral fracture with good alignment of fracture components. Electronically Signed   By: Lupita Raider, M.D.   On: 06/22/2017 15:55   Dg Femur Port, Min 2 Views Right  Result Date: 06/22/2017 CLINICAL DATA:  62 year old female with comminuted distal right femur condylar fracture. Subsequent encounter. EXAM: RIGHT FEMUR PORTABLE 2 VIEW COMPARISON:  06/21/2017. FINDINGS: Post open reduction and internal fixation of comminuted distal right femoral condyle fracture utilizing sideplate and screws with much better alignment of fracture fragments. Baseline  degenerative changes most notable lateral tibiofemoral joint space and patellofemoral joint space. IMPRESSION: Open reduction and internal fixation of comminuted distal right femur condylar fracture. Electronically Signed   By: Lacy Duverney M.D.   On: 06/22/2017 17:35        Scheduled Meds: . aspirin  81 mg Oral Daily  . cholecalciferol  1,000 Units Oral QHS  . enoxaparin (LOVENOX) injection  40 mg Subcutaneous Q24H  . Fingolimod HCl  0.5 mg Oral Daily  . losartan  50 mg Oral Daily  . pantoprazole  40 mg Oral Daily  . senna-docusate  1 tablet Oral BID   Continuous Infusions: . sodium chloride 75 mL/hr at 06/22/17 1800  . sodium chloride    .  ceFAZolin (ANCEF) IV Stopped (06/23/17 0537)  . methocarbamol (ROBAXIN)  IV       LOS: 2 days    Time spent: 35 minutes.     Alba Cory, MD [facility] Pager [phone]  If 7PM-7AM, please contact  night-coverage [email or web address] Password [email or web address] 06/23/2017, 2:52 PM

## 2017-06-23 NOTE — Evaluation (Signed)
Occupational Therapy Evaluation Patient Details Name: Tiffany Barnes MRN: [id number] DOB: 1955/08/28 Today's Date: 06/23/2017    History of Present Illness R distal femur fx, s/p ORIF. PMH of MS   Clinical Impression   Pt reports she was independent with ADL PTA and used a RW/cane for household mobility. Currently pt requires mod assist +2 for sit to stand, min assist for UB ADL, and max assist for LB ADL. Pt presenting with generalized weakness, impaired balance, and pain impacting her independence and safety with ADL and functional mobility. Recommending CIR level therapies to maximize independence and safety with ADL and functional mobility prior to return home. Pt would benefit from continued skilled OT to address established goals.    Follow Up Recommendations  CIR;Supervision/Assistance - 24 hour    Equipment Recommendations  None recommended by OT    Recommendations for Other Services       Precautions / Restrictions Precautions Precautions: Fall Precaution Comments:  Restrictions Weight Bearing Restrictions: Yes RLE Weight Bearing: Weight bearing as tolerated      Mobility Bed Mobility      General bed mobility comments: Pt OOB in chair upon arrival  Transfers Overall transfer level: Needs assistance Equipment used: Rolling walker (2 wheeled) Transfers: Sit to/from Stand Sit to Stand: Mod assist;+2 physical assistance        General transfer comment: to boost up from chair, husband assisting, blocked bil feet. Cues for hand placement and technique    Balance Overall balance assessment: Needs assistance Sitting-balance support: Feet supported;No upper extremity supported Sitting balance-Leahy Scale: Fair    Standing balance support: Bilateral upper extremity supported Standing balance-Leahy Scale: Poor                            ADL either performed or assessed with clinical judgement   ADL Overall ADL's : Needs  assistance/impaired Eating/Feeding: Set up;Sitting   Grooming: Supervision/safety;Sitting   Upper Body Bathing: Minimal assistance;Sitting   Lower Body Bathing: Maximal assistance;Sit to/from stand   Upper Body Dressing : Minimal assistance;Sitting   Lower Body Dressing: Maximal assistance;Sit to/from stand                 General ADL Comments: Mod assist +2 for sit to stand from chair, cues for hand placement and technique     Vision         Perception     Praxis      Pertinent Vitals/Pain Pain Assessment: Faces Pain Score: 9  Faces Pain Scale: Hurts even more Pain Location: RLE Pain Descriptors / Indicators: Aching;Sore Pain Intervention(s): Monitored during session;Limited activity within patient's tolerance     Hand Dominance     Extremity/Trunk Assessment Upper Extremity Assessment Upper Extremity Assessment: Generalized weakness   Lower Extremity Assessment Lower Extremity Assessment: Defer to PT evaluation    Cervical / Trunk Assessment Cervical / Trunk Assessment: Normal   Communication Communication Communication: No difficulties   Cognition Arousal/Alertness: Awake/alert Behavior During Therapy: WFL for tasks assessed/performed Overall Cognitive Status: Within Functional Limits for tasks assessed                                     General Comments       Exercises    Shoulder Instructions      Home Living Family/patient expects to be discharged to:: Private residence Living Arrangements: Spouse/significant other Available  Help at Discharge: Family;Available PRN/intermittently Type of Home: House Home Access: Stairs to enter [employer] of Steps: 3 Entrance Stairs-Rails: Can reach both Home Layout: One level     Bathroom Shower/Tub: Producer, television/film/video: Handicapped height     Home Equipment: Environmental consultant - 4 wheels;Cane - single point;Wheelchair - manual;Bedside commode;Grab bars -  tub/shower;Shower seat - built in;Grab bars - toilet          Prior Functioning/Environment Level of Independence: Independent with assistive device(s)        Comments: rollator to walk, WC long distances, independent bathing/dressing        OT Problem List: Decreased strength;Decreased range of motion;Decreased activity tolerance;Impaired balance (sitting and/or standing);Decreased knowledge of use of DME or AE;Decreased knowledge of precautions;Pain      OT Treatment/Interventions: Self-care/ADL training;Therapeutic exercise;DME and/or AE instruction;Therapeutic activities;Patient/family education;Balance training    OT Goals(Current goals can be found in the care plan section) Acute Rehab OT Goals Patient Stated Goal: get better and return home OT Goal Formulation: With patient/family Time For Goal Achievement: 07/07/17 Potential to Achieve Goals: Good ADL Goals Pt Will Perform Lower Body Bathing: with min assist;sit to/from stand Pt Will Perform Lower Body Dressing: with min assist;sit to/from stand Pt Will Transfer to Toilet: with mod assist;ambulating;bedside commode Pt Will Perform Toileting - Clothing Manipulation and hygiene: with min assist;sit to/from stand  OT Frequency: Min 2X/week   Barriers to D/C:            Co-evaluation              AM-PAC PT "6 Clicks" Daily Activity     Outcome Measure Help from another person eating meals?: A Little Help from another person taking care of personal grooming?: A Little Help from another person toileting, which includes using toliet, bedpan, or urinal?: A Lot Help from another person bathing (including washing, rinsing, drying)?: A Lot Help from another person to put on and taking off regular upper body clothing?: A Little Help from another person to put on and taking off regular lower body clothing?: A Lot 6 Click Score: 15   End of Session Equipment Utilized During Treatment: Rolling walker Nurse  Communication: Mobility status  Activity Tolerance: Patient tolerated treatment well Patient left: in chair;with call bell/phone within reach;with family/visitor present  OT Visit Diagnosis: Unsteadiness on feet (R26.81);Other abnormalities of gait and mobility (R26.89);History of falling (Z91.81);Muscle weakness (generalized) (M62.81);Pain Pain - Right/Left: Right Pain - part of body: Leg                Time: 8295-6213 OT Time Calculation (min): 14 min Charges:  OT General Charges $OT Visit: 1 Visit OT Evaluation $OT Eval Moderate Complexity: 1 Mod G-Codes:     Sugar Vanzandt A. Brett Albino, M.S., OTR/L Pager: [phone]  Gaye Alken 06/23/2017, 5:16 PM

## 2017-06-23 NOTE — Evaluation (Signed)
Physical Therapy Evaluation Patient Details Name: Tiffany Barnes MRN: [id number] DOB: [birth year] Today's Date: 06/23/2017   History of Present Illness  R distal femur fx, s/p ORIF. PMH of MS  Clinical Impression  Pt admitted with above diagnosis. Pt currently with functional limitations due to the deficits listed below (see PT Problem List). Max assist for sit to stand, pt with posterior lean in standing with RW. Mod assist to pivot to recliner. Pt is high fall risk, doesn't have 24* assist available at home as her husband works 4*/day. CIR consult recommended.  Pt will benefit from skilled PT to increase their independence and safety with mobility to allow discharge to the venue listed below.       Follow Up Recommendations CIR;Supervision/Assistance - 24 hour; SNF if not accepted at [facility]    Equipment Recommendations  None recommended by PT    Recommendations for Other Services Rehab consult     Precautions / Restrictions Precautions Precautions: Fall Precaution Comments: 1 fall just prior to admission, pt denies other falls in past 1 year, but has had several episodes of near syncope Restrictions Weight Bearing Restrictions: No RLE Weight Bearing: Weight bearing as tolerated      Mobility  Bed Mobility Overal bed mobility: Needs Assistance Bed Mobility: Supine to Sit     Supine to sit: Mod assist     General bed mobility comments: assist to raise trunk and scoot to edge of bed  Transfers Overall transfer level: Needs assistance Equipment used: Rolling walker (2 wheeled) Transfers: Sit to/from [employer] Sit to Stand: Max assist;+2 safety/equipment;From elevated surface Stand pivot transfers: Mod assist;+2 safety/equipment       General transfer comment: assist to rise and steady, posterior lean initially in standing, wide BOS, increased time/effort to stand and to come to neutral; sit to stand x 2 trials, SPT with increased  time/effort  Ambulation/Gait             General Gait Details: NT- pt limited by pain  Stairs            Wheelchair Mobility    Modified Rankin (Stroke Patients Only)       Balance Overall balance assessment: Needs assistance Sitting-balance support: Feet supported;No upper extremity supported Sitting balance-Leahy Scale: Fair   Postural control: Posterior lean Standing balance support: Bilateral upper extremity supported Standing balance-Leahy Scale: Poor Standing balance comment: required mod assist initially 2* posterior lean, after ~90 seconds was able to maintain neutral with BUE support                             Pertinent Vitals/Pain Pain Assessment: 0-10 Pain Score: 9  Pain Location: RLE in standing Pain Descriptors / Indicators: Sore;Sharp Pain Intervention(s): Limited activity within patient's tolerance;Monitored during session;RN gave pain meds during session;Ice applied    Home Living Family/patient expects to be discharged to:: Private residence Living Arrangements: Spouse/significant other Available Help at Discharge: Family;Available PRN/intermittently Type of Home: House Home Access: Stairs to enter Entrance Stairs-Rails: Can reach both Entrance Stairs-Number of Steps: 3 Home Layout: One level Home Equipment: Walker - 4 wheels;Cane - single point;Wheelchair - manual;Bedside commode;Grab bars - tub/shower;Shower seat - built in;Grab bars - toilet      Prior Function Level of Independence: Independent with assistive device(s)         Comments: rollator to walk, WC long distances, independent bathing/dressing     Hand Dominance  Extremity/Trunk Assessment   Upper Extremity Assessment Upper Extremity Assessment: Defer to OT evaluation    Lower Extremity Assessment Lower Extremity Assessment: RLE deficits/detail;LLE deficits/detail RLE Deficits / Details: knee flexion AAROM 30* limited by pain (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), knee ext -4/5, sensation intact to light touch LLE Deficits / Details: knee flexion AROM ~45* (pt states this is baseline); knee ext +4/5    Cervical / Trunk Assessment Cervical / Trunk Assessment: Normal  Communication   Communication: No difficulties  Cognition Arousal/Alertness: Awake/alert Behavior During Therapy: WFL for tasks assessed/performed Overall Cognitive Status: Within Functional Limits for tasks assessed                                        General Comments      Exercises General Exercises - Lower Extremity Heel Slides: AAROM;Right;5 reps;Supine Hip ABduction/ADduction: AAROM;Right;10 reps Straight Leg Raises: Supine   Assessment/Plan    PT Assessment Patient needs continued PT services  PT Problem List Decreased strength;Decreased range of motion;Decreased activity tolerance;Pain;Decreased balance;Decreased mobility       PT Treatment Interventions Gait training;DME instruction;Functional mobility training;Therapeutic activities;Stair training;Therapeutic exercise;Balance training;Patient/family education    PT Goals (Current goals can be found in the Care Plan section)  Acute Rehab PT Goals Patient Stated Goal: likes to go out to eat PT Goal Formulation: With patient/family Time For Goal Achievement: 07/07/17 Potential to Achieve Goals: Fair    Frequency Min 4X/week   Barriers to discharge Decreased caregiver support husband drives school bus 4 hours/day    Co-evaluation               AM-PAC PT "6 Clicks" Daily Activity  Outcome Measure Difficulty turning over in bed (including adjusting bedclothes, sheets and blankets)?: A Lot Difficulty moving from lying on back to sitting on the side of the bed? : Unable Difficulty sitting down on and standing up from a chair with arms (e.g., wheelchair, bedside commode, etc,.)?: Unable Help needed moving to and from a bed to chair (including a  wheelchair)?: A Lot Help needed walking in hospital room?: Total Help needed climbing 3-5 steps with a railing? : Total 6 Click Score: 8    End of Session Equipment Utilized During Treatment: Gait belt Activity Tolerance: Patient limited by fatigue;Patient limited by pain Patient left: in chair;with call bell/phone within reach Nurse Communication: Mobility status PT Visit Diagnosis: Unsteadiness on feet (R26.81);History of falling (Z91.81);Difficulty in walking, not elsewhere classified (R26.2);Muscle weakness (generalized) (M62.81);Pain Pain - Right/Left: Right Pain - part of body: Knee    Time: 1331-1404 PT Time Calculation (min) (ACUTE ONLY): 33 min   Charges:   PT Evaluation $PT Eval Moderate Complexity: 1 Mod PT Treatments $Therapeutic Activity: 8-22 mins   PT G Codes:         Tamala Ser 06/23/2017, 2:17 PM [phone]

## 2017-06-23 NOTE — Progress Notes (Signed)
Rehab Admissions Coordinator Note:  Patient was screened by Cleatrice Burke for appropriateness for an Inpatient Acute Rehab Consult per PT recommendation.   At this time, we are recommending Inpatient Rehab consult. I met with pt at bedside and she is aware that Rehab MD would have to consult and [employer] insurance would have to approve any rehab venue.   Cleatrice Burke 06/23/2017, 4:59 PM  I can be reached at [phone].

## 2017-06-24 DIAGNOSIS — D500 Iron deficiency anemia secondary to blood loss (chronic): Secondary | ICD-10-CM

## 2017-06-24 DIAGNOSIS — I5032 Chronic diastolic (congestive) heart failure: Secondary | ICD-10-CM

## 2017-06-24 DIAGNOSIS — K219 Gastro-esophageal reflux disease without esophagitis: Secondary | ICD-10-CM

## 2017-06-24 DIAGNOSIS — Z7409 Other reduced mobility: Secondary | ICD-10-CM

## 2017-06-24 DIAGNOSIS — G35 Multiple sclerosis: Secondary | ICD-10-CM

## 2017-06-24 DIAGNOSIS — S72321S Displaced transverse fracture of shaft of right femur, sequela: Secondary | ICD-10-CM

## 2017-06-24 DIAGNOSIS — I447 Left bundle-branch block, unspecified: Secondary | ICD-10-CM

## 2017-06-24 LAB — BASIC METABOLIC PANEL
Anion gap: 7 (ref 5–15)
BUN: 23 mg/dL — AB (ref 6–20)
CALCIUM: 8.3 mg/dL — AB (ref 8.9–10.3)
CO2: 26 mmol/L (ref 22–32)
Chloride: 108 mmol/L (ref 101–111)
Creatinine, Ser: 0.55 mg/dL (ref 0.44–1.00)
GFR calc Af Amer: >60 mL/min (ref >60)
GLUCOSE: 103 mg/dL — AB (ref 65–99)
Potassium: 3.7 mmol/L (ref 3.5–5.1)
Sodium: 141 mmol/L (ref 135–145)

## 2017-06-24 LAB — CBC
HCT: 29.5 % — ABNORMAL LOW (ref 36.0–46.0)
Hemoglobin: 9.3 g/dL — ABNORMAL LOW (ref 12.0–15.0)
MCH: 28.3 pg (ref 26.0–34.0)
MCHC: 31.5 g/dL (ref 30.0–36.0)
MCV: 89.7 fL (ref 78.0–100.0)
PLATELETS: 186 10*3/uL (ref 150–400)
RBC: 3.29 MIL/uL — ABNORMAL LOW (ref 3.87–5.11)
RDW: 14.5 % (ref 11.5–15.5)
WBC: 6.4 10*3/uL (ref 4.0–10.5)

## 2017-06-24 LAB — TYPE AND SCREEN
ABO/RH(D): A POS
ANTIBODY SCREEN: NEGATIVE
UNIT DIVISION: 0

## 2017-06-24 LAB — BPAM RBC
BLOOD PRODUCT EXPIRATION DATE: 201902132359
ISSUE DATE / TIME: 201901301358
UNIT TYPE AND RH: 6200

## 2017-06-24 MED ORDER — FERROUS SULFATE 325 (65 FE) MG PO TABS
325.0000 mg | ORAL_TABLET | Freq: Every day | ORAL | Status: DC
  Administered 2017-06-25: 325 mg via ORAL
  Filled 2017-06-24: qty 1

## 2017-06-24 MED ORDER — POLYETHYLENE GLYCOL 3350 17 G PO PACK
17.0000 g | PACK | Freq: Two times a day (BID) | ORAL | Status: DC
  Filled 2017-06-24 (×5): qty 1

## 2017-06-24 MED ORDER — HYDRALAZINE HCL 20 MG/ML IJ SOLN
10.0000 mg | Freq: Three times a day (TID) | INTRAMUSCULAR | Status: DC | PRN
  Administered 2017-06-24 – 2017-06-27 (×2): 10 mg via INTRAVENOUS
  Filled 2017-06-24 (×2): qty 1

## 2017-06-24 MED ORDER — FERROUS SULFATE 325 (65 FE) MG PO TABS: 325.0000 mg | ORAL_TABLET | Freq: Two times a day (BID) | ORAL | Status: DC

## 2017-06-24 MED ORDER — ONDANSETRON HCL 4 MG/2ML IJ SOLN
4.0000 mg | Freq: Four times a day (QID) | INTRAMUSCULAR | Status: DC | PRN
  Administered 2017-06-24: 4 mg via INTRAVENOUS
  Filled 2017-06-24: qty 2

## 2017-06-24 NOTE — Plan of Care (Signed)
  Progressing Education: Knowledge of General Education information will improve 06/24/2017 1134 - Progressing by Darreld Mclean, RN Health Behavior/Discharge Planning: Ability to manage health-related needs will improve 06/24/2017 1134 - Progressing by Darreld Mclean, RN Clinical Measurements: Ability to maintain clinical measurements within normal limits will improve 06/24/2017 1134 - Progressing by Darreld Mclean, RN Will remain free from infection 06/24/2017 1134 - Progressing by Darreld Mclean, RN Diagnostic test results will improve 06/24/2017 1134 - Progressing by Darreld Mclean, RN Respiratory complications will improve 06/24/2017 1134 - Progressing by Darreld Mclean, RN Cardiovascular complication will be avoided 06/24/2017 1134 - Progressing by Darreld Mclean, RN Activity: Risk for activity intolerance will decrease 06/24/2017 1134 - Progressing by Darreld Mclean, RN Nutrition: Adequate nutrition will be maintained 06/24/2017 1134 - Progressing by Darreld Mclean, RN Coping: Level of anxiety will decrease 06/24/2017 1134 - Progressing by Darreld Mclean, RN Elimination: Will not experience complications related to bowel motility 06/24/2017 1134 - Progressing by Darreld Mclean, RN Will not experience complications related to urinary retention 06/24/2017 1134 - Progressing by Darreld Mclean, RN Pain Managment: General experience of comfort will improve 06/24/2017 1134 - Progressing by Darreld Mclean, RN Safety: Ability to remain free from injury will improve 06/24/2017 1134 - Progressing by Darreld Mclean, RN Skin Integrity: Risk for impaired skin integrity will decrease 06/24/2017 1134 - Progressing by Darreld Mclean, RN

## 2017-06-24 NOTE — Progress Notes (Signed)
I have received a denial from [facility] for admission to CIR. I met with pt at bedside and she is aware . I have alerted Dr. Tyrell Antonio, RN CM and SW. We will sign off at this time. [phone]

## 2017-06-24 NOTE — Progress Notes (Signed)
Physical Therapy Treatment Patient Details Name: Tiffany Barnes MRN: [id number] DOB: Oct 22, 1955 Today's Date: 06/24/2017    History of Present Illness R distal femur fx, s/p ORIF. PMH of MS    PT Comments    Patient required +2 assist for OOB mobility. Continue to progress as tolerated.    Follow Up Recommendations  CIR;Supervision/Assistance - 24 hour     Equipment Recommendations  None recommended by PT    Recommendations for Other Services Rehab consult     Precautions / Restrictions Precautions Precautions: Fall Precaution Comments: 1 fall just prior to admission, pt denies other falls in past 1 year, but has had several episodes of near syncope Restrictions Weight Bearing Restrictions: Yes RLE Weight Bearing: Weight bearing as tolerated    Mobility  Bed Mobility Overal bed mobility: Needs Assistance Bed Mobility: Supine to Sit     Supine to sit: Mod assist     General bed mobility comments: assist to bring R LE to EOB and scoot hips   Transfers Overall transfer level: Needs assistance Equipment used: Rolling walker (2 wheeled) Transfers: Sit to/from Stand Sit to Stand: Mod assist;+2 physical assistance;From elevated surface Stand pivot transfers: Max assist;+2 physical assistance       General transfer comment: cues for safe hand placement and positioning of feet prior to attempt to stand; assist to power up itno standing and to gain balance upon stand; pt with posterior lean; max a +2 for balance, weight shifting, and management of RW when pivoting  Ambulation/Gait                 Stairs            Wheelchair Mobility    Modified Rankin (Stroke Patients Only)       Balance Overall balance assessment: Needs assistance Sitting-balance support: Feet supported;No upper extremity supported Sitting balance-Leahy Scale: Fair   Postural control: Posterior lean Standing balance support: Bilateral upper extremity supported Standing  balance-Leahy Scale: Poor                              Cognition Arousal/Alertness: Awake/alert Behavior During Therapy: WFL for tasks assessed/performed;Anxious Overall Cognitive Status: Within Functional Limits for tasks assessed                                        Exercises      General Comments General comments (skin integrity, edema, etc.): family present      Pertinent Vitals/Pain Pain Assessment: Faces Faces Pain Scale: Hurts even more Pain Location: RLE Pain Descriptors / Indicators: Aching;Sore Pain Intervention(s): Limited activity within patient's tolerance;Monitored during session;Repositioned;RN gave pain meds during session    Home Living     Available Help at Discharge: Family(spouse drives school bus)           Additional Comments: former Producer, television/film/video    Prior Function            PT Goals (current goals can now be found in the care plan section) Acute Rehab PT Goals Patient Stated Goal: get better and return home PT Goal Formulation: With patient/family Time For Goal Achievement: 07/07/17 Potential to Achieve Goals: Fair Progress towards PT goals: Progressing toward goals    Frequency    Min 4X/week      PT Plan Current plan remains appropriate    Co-evaluation  AM-PAC PT "6 Clicks" Daily Activity  Outcome Measure  Difficulty turning over in bed (including adjusting bedclothes, sheets and blankets)?: A Lot Difficulty moving from lying on back to sitting on the side of the bed? : Unable Difficulty sitting down on and standing up from a chair with arms (e.g., wheelchair, bedside commode, etc,.)?: Unable Help needed moving to and from a bed to chair (including a wheelchair)?: A Lot Help needed walking in hospital room?: Total Help needed climbing 3-5 steps with a railing? : Total 6 Click Score: 8    End of Session Equipment Utilized During Treatment: Gait belt Activity Tolerance:  Patient limited by pain;Patient limited by fatigue Patient left: in chair;with call bell/phone within reach;with family/visitor present Nurse Communication: Mobility status PT Visit Diagnosis: Unsteadiness on feet (R26.81);History of falling (Z91.81);Difficulty in walking, not elsewhere classified (R26.2);Muscle weakness (generalized) (M62.81);Pain Pain - Right/Left: Right Pain - part of body: Knee     Time: 1146-1209 PT Time Calculation (min) (ACUTE ONLY): 23 min  Charges:  $Therapeutic Activity: 23-37 mins                    G Codes:       Erline Levine, PTA Pager: [phone]     Carolynne Edouard 06/24/2017, 4:15 PM
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
## 2017-06-24 NOTE — NC FL2 (Signed)
[place] MEDICAID FL2 LEVEL OF CARE SCREENING TOOL     IDENTIFICATION  Patient Name: Tiffany Barnes Birthdate: 1955/08/08 Sex: female Admission Date (Current Location): 06/21/2017  [facility] and [place] Number:  Producer, television/film/video and Address:  [facility], [address] [place], [place] [address]      Provider Number: [id number]  Attending Physician Name and Address:  Alba Cory, MD  Relative Name and Phone Number:  Tiffany Barnes, spouse, [phone]    Current Level of Care: Hospital Recommended Level of Care: Skilled Nursing Facility Prior Approval Number:    Date Approved/Denied:   PASRR Number: [id number]  Discharge Plan: SNF    Current Diagnoses: Patient Active Problem List   Diagnosis Date Noted  . Degenerative arthritis of right knee 06/23/2017  . Fracture of femoral shaft, right, closed (HCC) 06/21/2017  . Syncope, vasovagal 06/21/2017  . Closed comminuted supracondylar fracture of femur, right, initial encounter (HCC) 06/21/2017  . Femur fracture, right (HCC) 06/21/2017  . Multiple sclerosis (HCC) 01/05/2013  . Abnormality of gait 01/05/2013  . Encounter for long-term (current) use of other medications 01/05/2013  . Open wound of knee, leg (except thigh), and ankle, complicated 10/31/2012    Orientation RESPIRATION BLADDER Height & Weight     Self, Time, Situation, Place  Normal Continent Weight: 174 lb 16 oz (79.4 kg) Height:  5\' 5"  (165.1 cm)  BEHAVIORAL SYMPTOMS/MOOD NEUROLOGICAL BOWEL NUTRITION STATUS      Continent Diet(See DC Summary)  AMBULATORY STATUS COMMUNICATION OF NEEDS Skin   Extensive Assist Verbally Surgical wounds                       Personal Care Assistance Level of Assistance  Bathing, Feeding, Dressing Bathing Assistance: Maximum assistance Feeding assistance: Limited assistance Dressing Assistance: Maximum assistance     Functional Limitations Info  Sight, Hearing, Speech Sight Info:  Adequate Hearing Info: Adequate Speech Info: Adequate    SPECIAL CARE FACTORS FREQUENCY  PT (By licensed PT), OT (By licensed OT)     PT Frequency: 5x week OT Frequency: 5x week            Contractures      Additional Factors Info  Code Status, Allergies Code Status Info: Full Allergies Info: NITROFURANTOIN            Current Medications (06/24/2017):  This is the current hospital active medication list Current Facility-Administered Medications  Medication Dose Route Frequency Provider Last Rate Last Dose  . 0.9 %  sodium chloride infusion   Intravenous Continuous Dhungel, Nishant, MD   Stopped at 06/23/17 1418  . acetaminophen (TYLENOL) tablet 650 mg  650 mg Oral Q6H PRN Dhungel, Nishant, MD       Or  . acetaminophen (TYLENOL) suppository 650 mg  650 mg Rectal Q6H PRN Dhungel, Nishant, MD      . aspirin chewable tablet 81 mg  81 mg Oral Daily Dhungel, Nishant, MD   81 mg at 06/23/17 0936  . cholecalciferol (VITAMIN D) tablet 1,000 Units  1,000 Units Oral QHS Dhungel, Nishant, MD   1,000 Units at 06/23/17 2228  . enoxaparin (LOVENOX) injection 40 mg  40 mg Subcutaneous Q24H Dhungel, Nishant, MD   40 mg at 06/23/17 1908  . Fingolimod HCl CAPS 0.5 mg  0.5 mg Oral Daily Dhungel, Nishant, MD   0.5 mg at 06/22/17 1819  . HYDROcodone-acetaminophen (NORCO/VICODIN) 5-325 MG per tablet 1-2 tablet  1-2 tablet Oral  Q6H PRN Eddie North, MD   2 tablet at 06/24/17 0551  . ketorolac (TORADOL) 30 MG/ML injection 30 mg  30 mg Intravenous Q6H PRN Dhungel, Nishant, MD   30 mg at 06/22/17 0006  . losartan (COZAAR) tablet 50 mg  50 mg Oral Daily Dhungel, Nishant, MD   50 mg at 06/23/17 0936  . methocarbamol (ROBAXIN) tablet 500 mg  500 mg Oral Q6H PRN Dhungel, Nishant, MD   500 mg at 06/23/17 0438   Or  . methocarbamol (ROBAXIN) 500 mg in dextrose 5 % 50 mL IVPB  500 mg Intravenous Q6H PRN Dhungel, Nishant, MD      . pantoprazole (PROTONIX) EC tablet 40 mg  40 mg Oral Daily Dhungel,  Nishant, MD   40 mg at 06/23/17 0936  . senna-docusate (Senokot-S) tablet 1 tablet  1 tablet Oral BID Regalado, Belkys A, MD   1 tablet at 06/23/17 2228     Discharge Medications: Please see discharge summary for a list of discharge medications.  Relevant Imaging Results:  Relevant Lab Results:   Additional Information SS#: [id number]  Tresa Moore, LCSW

## 2017-06-24 NOTE — Progress Notes (Signed)
Orthopaedic Trauma Progress Note  S: Doing well, pain controlled. No issues  O:  Vitals:   06/24/17 1938 06/24/17 2101  BP: (!) 158/73 139/65  Pulse: 65 65  Resp: 18 19  Temp: 97.8 F (36.6 C) 98.5 F (36.9 C)  SpO2: 99% 99%   RLE: dressing taken down. Incisions clean, dry and intact. Neurovascularly intact  Labs:  CBC    Component Value Date/Time   WBC 6.4 06/24/2017 0754   RBC 3.29 (L) 06/24/2017 0754   HGB 9.3 (L) 06/24/2017 0754   HGB 12.6 01/01/2017 1112   HCT 29.5 (L) 06/24/2017 0754   HCT 38.3 01/01/2017 1112   PLT 186 06/24/2017 0754   PLT 234 01/01/2017 1112   MCV 89.7 06/24/2017 0754   MCV 87 01/01/2017 1112   MCH 28.3 06/24/2017 0754   MCHC 31.5 06/24/2017 0754   RDW 14.5 06/24/2017 0754   RDW 14.7 01/01/2017 1112   LYMPHSABS 0.2 (L) 06/21/2017 1051   LYMPHSABS 0.4 (L) 01/01/2017 1112   MONOABS 0.8 06/21/2017 1051   EOSABS 0.0 06/21/2017 1051   EOSABS 0.1 01/01/2017 1112   BASOSABS 0.0 06/21/2017 1051   BASOSABS 0.0 01/01/2017 1112     A/P: 62 year old female with MS and end-stage knee arthritis s/p ORIF of right distal femur fracture  -WBAT RLE -PT/OT -Pain control -Dressing change PRN -Lovenox for VTE prophylaxis -Monitor CBC -Dispo:TBD  Roby Lofts, MD [facility] [phone] (phone)

## 2017-06-24 NOTE — Progress Notes (Signed)
I will begin insurance authorization for a possible inpt rehab admit pending insurance approval. Spoke with Dr. Sunnie Nielsen and she is aware. [phone]

## 2017-06-24 NOTE — Progress Notes (Signed)
PROGRESS NOTE    Tiffany Barnes  ZOX:[id number] DOB: November 25, 1955 DOA: 06/21/2017 PCP: Barbie Banner, MD    Brief Narrative:  Tiffany Barnes is a 62 y.o. female with a history of MS and chronic knee OA who presented to the ED at [facility] after falling at home. She describes an abrupt hot sensation rising into her face followed by lightheadedness and reported LOC, woke up on the ground with right leg pain above the knee where she had landed. She has been having very similar episodes, specifically while cooking over the stove, but usually can make it to seated position prior to passing out, improved with cool cloth. No seizure-like activity reported. No chest pain, dyspnea, palpitations.   Vitals were stable. Blood work showed erosive 13.4 K, hemoglobin of 11.8, normal chemistry. EKG shows sinus rhythm at 60 with incomplete LBBB (new since last EKG in the system from 2014) An x-ray of the hip was normal. X-ray of the knee showed mildly comminuted distal femoral shaft fracture with mild displacement. Chest x-ray was unremarkable. She was found to have a laceration over the anterior knee which was sutured by ED physician and given a dose of IV Ancef. Orthopedic surgery was consulted and recommended transfer to [facility] for repair 1/29 by Dr. Jena Gauss. Echocardiogram was performed showing preserved biventricular function with grade 1 diastolic dysfunction, no wall motion abnormalities or significant valvular disease.     Assessment & Plan:   Principal Problem:   Fracture of femoral shaft, right, closed (HCC) Active Problems:   Syncope, vasovagal   Closed comminuted supracondylar fracture of femur, right, initial encounter (HCC)   Femur fracture, right (HCC)   Degenerative arthritis of right knee  1-Right supracondylar femoral fracture:  - Dr. Jena Gauss S/P ; ORIF supracondylar right distal femur fracture -Intermediate repair knee laceration  -Pt per ortho.  -Bowel regimen, DVT prophylaxis per ortho.    2-Acute Blood loss anemia;  Post sx expected.  Hb drop from 11 to 8.  transfuse one unit PRBC> 1-30.  Hb increase to 9. Repeat labs in am.   3-Syncope;  ECHO normal EF, no wall motion abnormalities.  Needs follow up with cardiology for consideration for holter.  EKG today sinus rhythm.   Hypomagnesemia; replaced.   Chronic diastolic CHF and incomplete LBBB: Euvolemic at this time. EF 60-65%, G1DD, no WMA's.   Multiple sclerosis: Since the 1990's. Symptoms stable.  - Continue outpatient follow up with Dr. Marjory Lies  HTN: Chronic, stable.  - Continue losartan - Monitor BMP postop. - elevated this afternoon, will order PRN hydralazine.   GERD: Chronic, stable - Ok to continue PPI    DVT prophylaxis: Lovenox Code Status: full code.  Family Communication: husband who was at bedside.  Disposition Plan: to be determine.   Consultants:   Ortho    Procedures:  Echocardiogram 06/22/2017: - Left ventricle: The cavity size was normal. There was mild concentric hypertrophy. Systolic function was normal. The estimated ejection fraction was in the range of 60% to 65%. Wall motion was normal; there were no regional wall motion abnormalities. Doppler parameters are consistent with abnormal left ventricular relaxation (grade 1 diastolic dysfunction). Doppler parameters are consistent with indeterminate ventricular filling pressure. - Aortic valve: Transvalvular velocity was within the normal range. There was no stenosis. There was mild regurgitation. - Mitral valve: Transvalvular velocity was within the normal range. There was no evidence for stenosis. There was trivial regurgitation. - Right ventricle: The cavity size was normal. Wall thickness was  normal. Systolic function was normal. - Atrial septum: No defect or patent foramen ovale was identified by color flow Doppler. - Tricuspid valve: There was no  regurgitation.       Antimicrobials:  Ancef pre op/    Subjective: She is doing well, denies dyspnea, ,no BM yet but passing gas. Pain controlled with pain medications.   This afternoon she develops nausea and BP increased.    Objective: Vitals:   06/23/17 1722 06/23/17 2141 06/24/17 0610 06/24/17 1356  BP: (!) 115/48 134/76 (!) 156/69 (!) 174/88  Pulse: 70 62 (!) 58 71  Resp: 18 16 16    Temp: 98.3 F (36.8 C) 98 F (36.7 C) 98.6 F (37 C) (!) 97.5 F (36.4 C)  TempSrc: Oral Oral Oral Oral  SpO2:  98% 98% 100%  Weight:      Height:        Intake/Output Summary (Last 24 hours) at 06/24/2017 1410 Last data filed at 06/24/2017 1610 Gross per 24 hour  Intake 1479.5 ml  Output 500 ml  Net 979.5 ml   Filed Weights   06/21/17 0933 06/22/17 1211  Weight: 79.4 kg (175 lb) 79.4 kg (174 lb 16 oz)    Examination:  General exam: NAD Respiratory system: CTA Cardiovascular system: S 1, S 2 RRR Gastrointestinal system: BS present, soft, nt Central nervous system: non focal.  Extremities: right LE with clean dressing.  Skin: No rashes, lesions or ulcers   Data Reviewed: I have personally reviewed following labs and imaging studies  CBC: Recent Labs  Lab 06/21/17 1051 06/23/17 0706 06/24/17 0754  WBC 13.4* 7.2 6.4  NEUTROABS 12.4*  --   --   HGB 11.8* 8.3* 9.3*  HCT 37.5 25.6* 29.5*  MCV 90.1 88.9 89.7  PLT 243 174 186   Basic Metabolic Panel: Recent Labs  Lab 06/21/17 1051 06/23/17 0706 06/24/17 0754  NA 141 141 141  K 3.7 3.9 3.7  CL 104 109 108  CO2 27 23 26   GLUCOSE 122* 120* 103*  BUN 23* 24* 23*  CREATININE 0.60 0.68 0.55  CALCIUM 9.0 8.2* 8.3*  MG 1.6*  --   --    GFR: Estimated Creatinine Clearance: 76 mL/min (by C-G formula based on SCr of 0.55 mg/dL). Liver Function Tests: Recent Labs  Lab 06/21/17 1051  AST 25  ALT 19  ALKPHOS 117  BILITOT 0.8  PROT 6.2*  ALBUMIN 3.5   Recent Labs  Lab 06/21/17 1051  LIPASE 27   No  results for input(s): AMMONIA in the last 168 hours. Coagulation Profile: No results for input(s): INR, PROTIME in the last 168 hours. Cardiac Enzymes: No results for input(s): CKTOTAL, CKMB, CKMBINDEX, TROPONINI in the last 168 hours. BNP (last 3 results) No results for input(s): PROBNP in the last 8760 hours. HbA1C: No results for input(s): HGBA1C in the last 72 hours. CBG: Recent Labs  Lab 06/21/17 0936  GLUCAP 97   Lipid Profile: No results for input(s): CHOL, HDL, LDLCALC, TRIG, CHOLHDL, LDLDIRECT in the last 72 hours. Thyroid Function Tests: No results for input(s): TSH, T4TOTAL, FREET4, T3FREE, THYROIDAB in the last 72 hours. Anemia Panel: No results for input(s): VITAMINB12, FOLATE, FERRITIN, TIBC, IRON, RETICCTPCT in the last 72 hours. Sepsis Labs: No results for input(s): PROCALCITON, LATICACIDVEN in the last 168 hours.  Recent Results (from the past 240 hour(s))  Surgical pcr screen     Status: None   Collection Time: 06/22/17 12:51 AM  Result Value Ref Range Status  MRSA, PCR NEGATIVE NEGATIVE Final   Staphylococcus aureus NEGATIVE NEGATIVE Final    Comment: (NOTE) The Xpert SA Assay (FDA approved for NASAL specimens in patients 68 years of age and older), is one component of a comprehensive surveillance program. It is not intended to diagnose infection nor to guide or monitor treatment.          Radiology Studies: Dg Knee Complete 4 Views Right  Result Date: 06/22/2017 CLINICAL DATA:  Status post open reduction and internal fixation of distal right femur. EXAM: DG C-ARM 61-120 MIN; RIGHT KNEE - COMPLETE 4+ VIEW FLUOROSCOPY TIME:  1 minute 30 seconds. COMPARISON:  Radiographs of June 21, 2017. FINDINGS: Six intraoperative fluoroscopic images of the right femur and knee demonstrate surgical internal fixation of distal right femoral fracture. Good alignment of fracture components is noted. IMPRESSION: Status post surgical internal fixation of distal right  femoral fracture with good alignment of fracture components. Electronically Signed   By: Lupita Raider, M.D.   On: 06/22/2017 15:55   Dg C-arm 1-60 Min  Result Date: 06/22/2017 CLINICAL DATA:  Status post open reduction and internal fixation of distal right femur. EXAM: DG C-ARM 61-120 MIN; RIGHT KNEE - COMPLETE 4+ VIEW FLUOROSCOPY TIME:  1 minute 30 seconds. COMPARISON:  Radiographs of June 21, 2017. FINDINGS: Six intraoperative fluoroscopic images of the right femur and knee demonstrate surgical internal fixation of distal right femoral fracture. Good alignment of fracture components is noted. IMPRESSION: Status post surgical internal fixation of distal right femoral fracture with good alignment of fracture components. Electronically Signed   By: Lupita Raider, M.D.   On: 06/22/2017 15:55   Dg C-arm 1-60 Min  Result Date: 06/22/2017 CLINICAL DATA:  Status post open reduction and internal fixation of distal right femur. EXAM: DG C-ARM 61-120 MIN; RIGHT KNEE - COMPLETE 4+ VIEW FLUOROSCOPY TIME:  1 minute 30 seconds. COMPARISON:  Radiographs of June 21, 2017. FINDINGS: Six intraoperative fluoroscopic images of the right femur and knee demonstrate surgical internal fixation of distal right femoral fracture. Good alignment of fracture components is noted. IMPRESSION: Status post surgical internal fixation of distal right femoral fracture with good alignment of fracture components. Electronically Signed   By: Lupita Raider, M.D.   On: 06/22/2017 15:55   Dg Femur Port, Min 2 Views Right  Result Date: 06/22/2017 CLINICAL DATA:  62 year old female with comminuted distal right femur condylar fracture. Subsequent encounter. EXAM: RIGHT FEMUR PORTABLE 2 VIEW COMPARISON:  06/21/2017. FINDINGS: Post open reduction and internal fixation of comminuted distal right femoral condyle fracture utilizing sideplate and screws with much better alignment of fracture fragments. Baseline degenerative changes most  notable lateral tibiofemoral joint space and patellofemoral joint space. IMPRESSION: Open reduction and internal fixation of comminuted distal right femur condylar fracture. Electronically Signed   By: Lacy Duverney M.D.   On: 06/22/2017 17:35        Scheduled Meds: . aspirin  81 mg Oral Daily  . cholecalciferol  1,000 Units Oral QHS  . enoxaparin (LOVENOX) injection  40 mg Subcutaneous Q24H  . ferrous sulfate  325 mg Oral BID WC  . Fingolimod HCl  0.5 mg Oral Daily  . losartan  50 mg Oral Daily  . pantoprazole  40 mg Oral Daily  . polyethylene glycol  17 g Oral BID  . senna-docusate  1 tablet Oral BID   Continuous Infusions: . sodium chloride Stopped (06/23/17 1418)  . methocarbamol (ROBAXIN)  IV  LOS: 3 days    Time spent: 35 minutes.     Tiffany Cory, MD [facility] Pager [phone]  If 7PM-7AM, please contact night-coverage [email or web address] Password [email or web address] 06/24/2017, 2:10 PM

## 2017-06-25 DIAGNOSIS — R002 Palpitations: Secondary | ICD-10-CM

## 2017-06-25 LAB — GLUCOSE, CAPILLARY
GLUCOSE-CAPILLARY: 79 mg/dL (ref 65–99)
Glucose-Capillary: 84 mg/dL (ref 65–99)

## 2017-06-25 LAB — CBC
HEMATOCRIT: 30.1 % — AB (ref 36.0–46.0)
HEMOGLOBIN: 9.6 g/dL — AB (ref 12.0–15.0)
MCH: 28.7 pg (ref 26.0–34.0)
MCHC: 31.9 g/dL (ref 30.0–36.0)
MCV: 90.1 fL (ref 78.0–100.0)
Platelets: 196 10*3/uL (ref 150–400)
RBC: 3.34 MIL/uL — AB (ref 3.87–5.11)
RDW: 14.5 % (ref 11.5–15.5)
WBC: 4.5 10*3/uL (ref 4.0–10.5)

## 2017-06-25 LAB — MAGNESIUM: MAGNESIUM: 1.9 mg/dL (ref 1.7–2.4)

## 2017-06-25 LAB — TROPONIN I: Troponin I: <0.03 ng/mL (ref <0.03)

## 2017-06-25 MED ORDER — HYDROCODONE-ACETAMINOPHEN 5-325 MG PO TABS: 1.0000 | ORAL_TABLET | Freq: Four times a day (QID) | ORAL | 0 refills | Status: DC | PRN

## 2017-06-25 MED ORDER — ALPRAZOLAM 0.25 MG PO TABS
0.2500 mg | ORAL_TABLET | Freq: Two times a day (BID) | ORAL | Status: DC | PRN
  Administered 2017-06-25 – 2017-06-28 (×6): 0.25 mg via ORAL
  Filled 2017-06-25 (×7): qty 1

## 2017-06-25 MED ORDER — SODIUM CHLORIDE 0.9 % IV SOLN
INTRAVENOUS | Status: DC
  Administered 2017-06-25 – 2017-06-27 (×2): via INTRAVENOUS

## 2017-06-25 MED ORDER — ENOXAPARIN SODIUM 40 MG/0.4ML ~~LOC~~ SOLN: 40.0000 mg | SUBCUTANEOUS | 0 refills | Status: DC

## 2017-06-25 MED ORDER — BISACODYL 10 MG RE SUPP
10.0000 mg | Freq: Once | RECTAL | Status: AC
  Administered 2017-06-25: 10 mg via RECTAL
  Filled 2017-06-25: qty 1

## 2017-06-25 NOTE — Plan of Care (Signed)
Pt is a/o x4. Anxious. Xanax given x1. Vss. Ra. Bilateral breath sounds clear. Bowel sounds present. Complaints of constipation. Stool softner and suppository given. Voiding. Falls precautions in place. Able to turn self. Lovenox for vte. Will continue to monitor.

## 2017-06-25 NOTE — Progress Notes (Addendum)
PROGRESS NOTE    Tiffany Barnes  ONG:[id number] DOB: [birth year] DOA: 06/21/2017 PCP: Tiffany Banner, MD    Brief Narrative:  Tiffany Barnes is a 62 y.o. female with a history of MS and chronic knee OA who presented to the ED at [facility] after falling at home. She describes an abrupt hot sensation rising into her face followed by lightheadedness and reported LOC, woke up on the ground with right leg pain above the knee where she had landed. She has been having very similar episodes, specifically while cooking over the stove, but usually can make it to seated position prior to passing out, improved with cool cloth. No seizure-like activity reported. No chest pain, dyspnea, palpitations.   Vitals were stable. Blood work showed erosive 13.4 K, hemoglobin of 11.8, normal chemistry. EKG shows sinus rhythm at 60 with incomplete LBBB (new since last EKG in the system from 2014) An x-ray of the hip was normal. X-ray of the knee showed mildly comminuted distal femoral shaft fracture with mild displacement. Chest x-ray was unremarkable. She was found to have a laceration over the anterior knee which was sutured by ED physician and given a dose of IV Ancef. Orthopedic surgery was consulted and recommended transfer to [facility] for repair 1/29 by Dr. Jena Barnes. Echocardiogram was performed showing preserved biventricular function with grade 1 diastolic dysfunction, no wall motion abnormalities or significant valvular disease.     Assessment & Plan:   Principal Problem:   Fracture of femoral shaft, right, closed (HCC) Active Problems:   Syncope, vasovagal   Closed comminuted supracondylar fracture of femur, right, initial encounter (HCC)   Femur fracture, right (HCC)   Degenerative arthritis of right knee  1-Right supracondylar femoral fracture:  - Dr. Jena Barnes S/P ; ORIF supracondylar right distal femur fracture -Intermediate repair knee laceration  -Pt per ortho.  -Bowel regimen, DVT prophylaxis per ortho.    2-Acute Blood loss anemia;  Post sx expected.  Hb drop from 11 to 8.  transfuse one unit PRBC> 1-30.  Hb stable at 9.6.  Check orthostatic, might need Blood transfusion.   3-Syncope;  ECHO normal EF, no wall motion abnormalities.  EKG today sinus rhythm.  She is feeling nausea, anxious, hr has decrease down to 48 overnight. At this time HR in the 60.  I will check EKG, troponin. Cardiology consulted.  She report an episode of lightheaded yesterday after she sat on the chair. Will check orthostatic.  Resume home dose xanax PRN.  Check cbg.  Will ask cardiology evaluation ? Is this autonomic dysfunction or cardiac related ?   Constipation;  Dulcolax suppository   Hypomagnesemia; replaced.   Chronic diastolic CHF and incomplete LBBB: Euvolemic at this time. EF 60-65%, G1DD, no WMA's.   Multiple sclerosis: Since the 1990's. Symptoms stable.  - Continue outpatient follow up with Dr. Marjory Barnes  HTN: Chronic, stable.  - Continue losartan - Monitor BP postop. -PRN hydralazine.   GERD: Chronic, stable - Ok to continue PPI    DVT prophylaxis: Lovenox Code Status: full code.  Family Communication: husband who was at bedside.  Disposition Plan: to be determine.   Consultants:   Ortho    Procedures:  Echocardiogram 06/22/2017: - Left ventricle: The cavity size was normal. There was mild concentric hypertrophy. Systolic function was normal. The estimated ejection fraction was in the range of 60% to 65%. Wall motion was normal; there were no regional wall motion abnormalities. Doppler parameters are consistent with abnormal left ventricular relaxation (grade  1 diastolic dysfunction). Doppler parameters are consistent with indeterminate ventricular filling pressure. - Aortic valve: Transvalvular velocity was within the normal range. There was no stenosis. There was mild regurgitation. - Mitral valve: Transvalvular velocity was within the normal  range. There was no evidence for stenosis. There was trivial regurgitation. - Right ventricle: The cavity size was normal. Wall thickness was normal. Systolic function was normal. - Atrial septum: No defect or patent foramen ovale was identified by color flow Doppler. - Tricuspid valve: There was no regurgitation.       Antimicrobials:  Ancef pre op/    Subjective: She is feeling anxious today, denies chest pain, complaints of nausea. No BM yet.  Does relates some SOB.  Per husband patient has had 3 prior episodes that she pass out with in a year.    Objective: Vitals:   06/24/17 1500 06/24/17 1938 06/24/17 2101 06/25/17 0428  BP: 115/89 (!) 158/73 139/65 (!) 148/70  Pulse: 71 65 65 60  Resp:  18 19 18   Temp:  97.8 F (36.6 C) 98.5 F (36.9 C) 97.6 F (36.4 C)  TempSrc:  Axillary Oral Oral  SpO2:  99% 99% 98%  Weight:      Height:        Intake/Output Summary (Last 24 hours) at 06/25/2017 1117 Last data filed at 06/25/2017 0102 Gross per 24 hour  Intake -  Output 1525 ml  Net -1525 ml   Filed Weights   06/21/17 0933 06/22/17 1211  Weight: 79.4 kg (175 lb) 79.4 kg (174 lb 16 oz)    Examination:  General exam: anxious.  Respiratory system: CTA Cardiovascular system: S 1, S 2 RRR Gastrointestinal system: BS present, soft, nt Central nervous system: alert, following command.  Extremities: right LE with clean dressing.  Skin: No rash    Data Reviewed: I have personally reviewed following labs and imaging studies  CBC: Recent Labs  Lab 06/21/17 1051 06/23/17 0706 06/24/17 0754 06/25/17 0551  WBC 13.4* 7.2 6.4 4.5  NEUTROABS 12.4*  --   --   --   HGB 11.8* 8.3* 9.3* 9.6*  HCT 37.5 25.6* 29.5* 30.1*  MCV 90.1 88.9 89.7 90.1  PLT 243 174 186 196   Basic Metabolic Panel: Recent Labs  Lab 06/21/17 1051 06/23/17 0706 06/24/17 0754 06/25/17 0551  NA 141 141 141  --   K 3.7 3.9 3.7  --   CL 104 109 108  --   CO2 27 23 26   --   GLUCOSE  122* 120* 103*  --   BUN 23* 24* 23*  --   CREATININE 0.60 0.68 0.55  --   CALCIUM 9.0 8.2* 8.3*  --   MG 1.6*  --   --  1.9   GFR: Estimated Creatinine Clearance: 76 mL/min (by C-G formula based on SCr of 0.55 mg/dL). Liver Function Tests: Recent Labs  Lab 06/21/17 1051  AST 25  ALT 19  ALKPHOS 117  BILITOT 0.8  PROT 6.2*  ALBUMIN 3.5   Recent Labs  Lab 06/21/17 1051  LIPASE 27   No results for input(s): AMMONIA in the last 168 hours. Coagulation Profile: No results for input(s): INR, PROTIME in the last 168 hours. Cardiac Enzymes: No results for input(s): CKTOTAL, CKMB, CKMBINDEX, TROPONINI in the last 168 hours. BNP (last 3 results) No results for input(s): PROBNP in the last 8760 hours. HbA1C: No results for input(s): HGBA1C in the last 72 hours. CBG: Recent Labs  Lab 06/21/17 0936 06/25/17  0721  GLUCAP 97 79   Lipid Profile: No results for input(s): CHOL, HDL, LDLCALC, TRIG, CHOLHDL, LDLDIRECT in the last 72 hours. Thyroid Function Tests: No results for input(s): TSH, T4TOTAL, FREET4, T3FREE, THYROIDAB in the last 72 hours. Anemia Panel: No results for input(s): VITAMINB12, FOLATE, FERRITIN, TIBC, IRON, RETICCTPCT in the last 72 hours. Sepsis Labs: No results for input(s): PROCALCITON, LATICACIDVEN in the last 168 hours.  Recent Results (from the past 240 hour(s))  Surgical pcr screen     Status: None   Collection Time: 06/22/17 12:51 AM  Result Value Ref Range Status   MRSA, PCR NEGATIVE NEGATIVE Final   Staphylococcus aureus NEGATIVE NEGATIVE Final    Comment: (NOTE) The Xpert SA Assay (FDA approved for NASAL specimens in patients 60 years of age and older), is one component of a comprehensive surveillance program. It is not intended to diagnose infection nor to guide or monitor treatment.          Radiology Studies: No results found.      Scheduled Meds: . aspirin  81 mg Oral Daily  . bisacodyl  10 mg Rectal Once  . cholecalciferol   1,000 Units Oral QHS  . enoxaparin (LOVENOX) injection  40 mg Subcutaneous Q24H  . ferrous sulfate  325 mg Oral Q breakfast  . Fingolimod HCl  0.5 mg Oral Daily  . losartan  50 mg Oral Daily  . pantoprazole  40 mg Oral Daily  . polyethylene glycol  17 g Oral BID  . senna-docusate  1 tablet Oral BID   Continuous Infusions: . methocarbamol (ROBAXIN)  IV       LOS: 4 days    Time spent: 35 minutes.     Alba Cory, MD [facility] Pager [phone]  If 7PM-7AM, please contact night-coverage [email or web address] Password [email or web address] 06/25/2017, 11:17 AM

## 2017-06-25 NOTE — Social Work (Signed)
CSW spoke with patient and spouse and they accepted SNF bed from [facility].  CSW confirmed with SNF admission, Elease Hashimoto, and they indicated that they would start auth for SNF placement.  CSW will continue to follow as patient will need auth prior to discharge to SNF.  CSW will continue to follow.  Keene Breath, LCSW Clinical Social Worker [phone]

## 2017-06-25 NOTE — Progress Notes (Signed)
I received a call from Dr. Sunnie Nielsen today to update me that she was requesting a cardiology consult today for pt and may want to reconsider a peer to peer with [facility] pending cardiology recommendations.I have notified SW that I continue to follow this case in case peer to peer will be pursued. I have not discussed with pt at this time. [phone]

## 2017-06-25 NOTE — Care Management Important Message (Signed)
Important Message  Patient Details  Name: AVONA CONLIN MRN: [id number] Date of Birth: 08/17/1955   Medicare Important Message Given:  Yes    Lawerance Sabal, RN 06/25/2017, 11:44 AM

## 2017-06-25 NOTE — Social Work (Addendum)
Pt was not approved for [facility]. CSW met with patient this morning to discuss SNF options. CSW discussed insurance auth needed for placement. Pt understands.  CSW f/u with SNF's as patient was not accepted at [facility]. Pt interested in [facility] and [place] f/u.  10:00am- CSW spoke with Warden Fillers in admission at [facility] and [place]. CSW will send clinicals for review.  CSW also contacted admissions again at [facility] and the admission will review again.  CSW will f/u.  11:14am: met with patient and spouse at bedside to discuss [place] offers from [facility] and [facility] and [place]. Pt needs more time to think about it.  CSW reiterated again the insurance auth process that is needed. Patient/spouse voiced understanding.     CSW will continue to follow up.  Elissa Hefty, LCSW Clinical Social Worker [phone]

## 2017-06-25 NOTE — Progress Notes (Signed)
Orthopaedic Trauma Progress Note  S: Anxious. Denied by insurance for CIR  O:  Vitals:   06/24/17 2101 06/25/17 0428  BP: 139/65 (!) 148/70  Pulse: 65 60  Resp: 19 18  Temp: 98.5 F (36.9 C) 97.6 F (36.4 C)  SpO2: 99% 98%   RLE:. Incisions clean, dry and intact. Neurovascularly intact  Labs:  CBC    Component Value Date/Time   WBC 4.5 06/25/2017 0551   RBC 3.34 (L) 06/25/2017 0551   HGB 9.6 (L) 06/25/2017 0551   HGB 12.6 01/01/2017 1112   HCT 30.1 (L) 06/25/2017 0551   HCT 38.3 01/01/2017 1112   PLT 196 06/25/2017 0551   PLT 234 01/01/2017 1112   MCV 90.1 06/25/2017 0551   MCV 87 01/01/2017 1112   MCH 28.7 06/25/2017 0551   MCHC 31.9 06/25/2017 0551   RDW 14.5 06/25/2017 0551   RDW 14.7 01/01/2017 1112   LYMPHSABS 0.2 (L) 06/21/2017 1051   LYMPHSABS 0.4 (L) 01/01/2017 1112   MONOABS 0.8 06/21/2017 1051   EOSABS 0.0 06/21/2017 1051   EOSABS 0.1 01/01/2017 1112   BASOSABS 0.0 06/21/2017 1051   BASOSABS 0.0 01/01/2017 1112     A/P: 62 year old female with MS and end-stage knee arthritis s/p ORIF of right distal femur fracture  -WBAT RLE -PT/OT -Leave incisions open to air, may shower -Lovenox for VTE prophylaxis -Dispo:Likely SNF  Roby Lofts, MD [facility] [phone] (phone)

## 2017-06-25 NOTE — Progress Notes (Signed)
Orthopedic Tech Progress Note Patient Details:  Tiffany Barnes Sep 10, 1955 [id number] OHF Applied  Patient ID: Tiffany Barnes, female   DOB: November 29, 1955, 62 y.o.   MRN: [id number]   Tiffany Barnes 06/25/2017, 9:58 PM
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
## 2017-06-25 NOTE — Progress Notes (Signed)
Physical Therapy Treatment Patient Details Name: Tiffany Barnes MRN: [id number] DOB: 1955-06-19 Today's Date: 06/25/2017    History of Present Illness R distal femur fx, s/p ORIF. PMH of MS    PT Comments    Patient required mod/max A +2 with use of Stedy for transfers EOB <> BSC. Pt is limited by anxiety with mobility. See general comments below for vitals taken once sitting up on BSC. Pt c/o feeling as though she would "pass out". HR elevated from 82-126. Continue to progress as tolerated.    Follow Up Recommendations  CIR;Supervision/Assistance - 24 hour     Equipment Recommendations  None recommended by PT    Recommendations for Other Services Rehab consult     Precautions / Restrictions Precautions Precautions: Fall Precaution Comments: 1 fall just prior to admission, pt denies other falls in past 1 year, but has had several episodes of near syncope Restrictions Weight Bearing Restrictions: Yes RLE Weight Bearing: Weight bearing as tolerated    Mobility  Bed Mobility Overal bed mobility: Needs Assistance Bed Mobility: Supine to Sit     Supine to sit: Mod assist     General bed mobility comments: assist to scoot hips and elevate trunk into sitting; use of rail   Transfers Overall transfer level: Needs assistance Equipment used: Rolling walker (2 wheeled) Transfers: Sit to/from Stand Sit to Stand: Mod assist;+2 physical assistance;Max assist         General transfer comment: assist to power up into standing and facilitation at hips to get Stedy flaps under pt; max A +2 from [facility]  Ambulation/Gait                 Stairs            Wheelchair Mobility    Modified Rankin (Stroke Patients Only)       Balance Overall balance assessment: Needs assistance Sitting-balance support: Feet supported;No upper extremity supported Sitting balance-Leahy Scale: Fair                                      Cognition Arousal/Alertness:  Awake/alert Behavior During Therapy: WFL for tasks assessed/performed;Anxious Overall Cognitive Status: Within Functional Limits for tasks assessed                                        Exercises      General Comments General comments (skin integrity, edema, etc.): Pt c/o feeling like "passing out"; pt is very anxious about mobility and required max cues for breathing technique to prevent hyperventilation; BP 157/68 (93), pulse 82 and up to 126 while sitting on BSC, and SpO2 100% on RA      Pertinent Vitals/Pain Pain Assessment: Faces Faces Pain Scale: Hurts even more Pain Location: RLE Pain Descriptors / Indicators: Aching;Sore Pain Intervention(s): Limited activity within patient's tolerance;Monitored during session;Premedicated before session;Repositioned    Home Living                      Prior Function            PT Goals (current goals can now be found in the care plan section) Acute Rehab PT Goals PT Goal Formulation: With patient/family Time For Goal Achievement: 07/07/17 Potential to Achieve Goals: Fair    Frequency    Min 4X/week  PT Plan Current plan remains appropriate    Co-evaluation              AM-PAC PT "6 Clicks" Daily Activity  Outcome Measure  Difficulty turning over in bed (including adjusting bedclothes, sheets and blankets)?: Unable Difficulty moving from lying on back to sitting on the side of the bed? : Unable Difficulty sitting down on and standing up from a chair with arms (e.g., wheelchair, bedside commode, etc,.)?: Unable Help needed moving to and from a bed to chair (including a wheelchair)?: A Lot Help needed walking in hospital room?: Total Help needed climbing 3-5 steps with a railing? : Total 6 Click Score: 7    End of Session Equipment Utilized During Treatment: Gait belt Activity Tolerance: Patient limited by pain;Other (comment)(pt limited by anxiety) Patient left: with call bell/phone  within reach;in bed;with nursing/sitter in room Nurse Communication: Mobility status PT Visit Diagnosis: Unsteadiness on feet (R26.81);History of falling (Z91.81);Difficulty in walking, not elsewhere classified (R26.2);Muscle weakness (generalized) (M62.81);Pain Pain - Right/Left: Right Pain - part of body: Knee     Time: 8832-5498 PT Time Calculation (min) (ACUTE ONLY): 51 min  Charges:  $Therapeutic Activity: 38-52 mins                    G Codes:       Erline Levine, PTA Pager: [phone]     Carolynne Edouard 06/25/2017, 5:23 PM

## 2017-06-26 LAB — BASIC METABOLIC PANEL
ANION GAP: 8 (ref 5–15)
BUN: 12 mg/dL (ref 6–20)
CHLORIDE: 105 mmol/L (ref 101–111)
CO2: 27 mmol/L (ref 22–32)
Calcium: 8.4 mg/dL — ABNORMAL LOW (ref 8.9–10.3)
Creatinine, Ser: 0.6 mg/dL (ref 0.44–1.00)
GFR calc non Af Amer: >60 mL/min (ref >60)
Glucose, Bld: 127 mg/dL — ABNORMAL HIGH (ref 65–99)
POTASSIUM: 3.3 mmol/L — AB (ref 3.5–5.1)
Sodium: 140 mmol/L (ref 135–145)

## 2017-06-26 LAB — CBC
HEMATOCRIT: 30.5 % — AB (ref 36.0–46.0)
HEMOGLOBIN: 9.7 g/dL — AB (ref 12.0–15.0)
MCH: 28.9 pg (ref 26.0–34.0)
MCHC: 31.8 g/dL (ref 30.0–36.0)
MCV: 90.8 fL (ref 78.0–100.0)
Platelets: 220 10*3/uL (ref 150–400)
RBC: 3.36 MIL/uL — ABNORMAL LOW (ref 3.87–5.11)
RDW: 14.7 % (ref 11.5–15.5)
WBC: 5.6 10*3/uL (ref 4.0–10.5)

## 2017-06-26 MED ORDER — FERROUS SULFATE 325 (65 FE) MG PO TABS
325.0000 mg | ORAL_TABLET | Freq: Two times a day (BID) | ORAL | Status: DC
  Administered 2017-06-26 – 2017-06-28 (×4): 325 mg via ORAL
  Filled 2017-06-26 (×4): qty 1

## 2017-06-26 MED ORDER — POTASSIUM CHLORIDE CRYS ER 20 MEQ PO TBCR
40.0000 meq | EXTENDED_RELEASE_TABLET | Freq: Once | ORAL | Status: AC
  Administered 2017-06-26: 40 meq via ORAL
  Filled 2017-06-26: qty 2

## 2017-06-26 NOTE — Progress Notes (Signed)
PROGRESS NOTE    Tiffany Barnes  ZOX:[id number] DOB: 16-Feb-1956 DOA: 06/21/2017 PCP: Barbie Banner, MD    Brief Narrative:  Tiffany Barnes is a 62 y.o. female with a history of MS and chronic knee OA who presented to the ED at [facility] after falling at home. She describes an abrupt hot sensation rising into her face followed by lightheadedness and reported LOC, woke up on the ground with right leg pain above the knee where she had landed. She has been having very similar episodes, specifically while cooking over the stove, but usually can make it to seated position prior to passing out, improved with cool cloth. No seizure-like activity reported. No chest pain, dyspnea, palpitations.   Vitals were stable. Blood work showed erosive 13.4 K, hemoglobin of 11.8, normal chemistry. EKG shows sinus rhythm at 60 with incomplete LBBB (new since last EKG in the system from 2014) An x-ray of the hip was normal. X-ray of the knee showed mildly comminuted distal femoral shaft fracture with mild displacement. Chest x-ray was unremarkable. She was found to have a laceration over the anterior knee which was sutured by ED physician and given a dose of IV Ancef. Orthopedic surgery was consulted and recommended transfer to [facility] for repair 1/29 by Dr. Jena Gauss. Echocardiogram was performed showing preserved biventricular function with grade 1 diastolic dysfunction, no wall motion abnormalities or significant valvular disease.     Assessment & Plan:   Principal Problem:   Fracture of femoral shaft, right, closed (HCC) Active Problems:   Syncope   Closed comminuted supracondylar fracture of femur, right, initial encounter (HCC)   Femur fracture, right (HCC)   Degenerative arthritis of right knee  1-Right supracondylar femoral fracture:  -Dr. Jena Gauss S/P ; ORIF supracondylar right distal femur fracture -Intermediate repair knee laceration  -Pt per ortho.  -Bowel regimen, DVT prophylaxis per ortho.  -Had BM after  suppository.   2-Acute Blood loss anemia;  Post sx expected.  Hb drop from 11 to 8.  transfuse one unit PRBC> 1-30.  Hb stable at 9.6.    3-Syncope;  ECHO normal EF, no wall motion abnormalities.  EKG today sinus rhythm.  Near syncope episode in the hospital.  Resume home dose xanax PRN.  Will ask PT today to check orthostatic vitals.   Constipation;  Dulcolax suppository  Had BM.   Hypomagnesemia; replaced.   Chronic diastolic CHF and incomplete LBBB: Euvolemic at this time. EF 60-65%, G1DD, no WMA's.   Multiple sclerosis: Since the 1990's. Symptoms stable.  - Continue outpatient follow up with Dr. Marjory Lies  HTN: Chronic, stable.  - Continue losartan - Monitor BP postop. -PRN hydralazine.   GERD: Chronic, stable - Ok to continue PPI    DVT prophylaxis: Lovenox Code Status: full code.  Family Communication: husband who was at bedside.  Disposition Plan: to be determine.   Consultants:   Ortho    Procedures:  Echocardiogram 06/22/2017: - Left ventricle: The cavity size was normal. There was mild concentric hypertrophy. Systolic function was normal. The estimated ejection fraction was in the range of 60% to 65%. Wall motion was normal; there were no regional wall motion abnormalities. Doppler parameters are consistent with abnormal left ventricular relaxation (grade 1 diastolic dysfunction). Doppler parameters are consistent with indeterminate ventricular filling pressure. - Aortic valve: Transvalvular velocity was within the normal range. There was no stenosis. There was mild regurgitation. - Mitral valve: Transvalvular velocity was within the normal range. There was no evidence for stenosis.  There was trivial regurgitation. - Right ventricle: The cavity size was normal. Wall thickness was normal. Systolic function was normal. - Atrial septum: No defect or patent foramen ovale was identified by color flow Doppler. - Tricuspid  valve: There was no regurgitation.       Antimicrobials:  Ancef pre op/    Subjective: She is feeling ok, no episodes today,. Has not been out of bed.   Objective: Vitals:   06/25/17 1534 06/26/17 0405 06/26/17 0743 06/26/17 1300  BP:  (!) 157/63 100/73 (!) 132/59  Pulse: 63 70 80 71  Resp:   20 18  Temp: 98.2 F (36.8 C) 98.4 F (36.9 C) 97.6 F (36.4 C) 98.2 F (36.8 C)  TempSrc: Oral Oral Oral Oral  SpO2: 98% 98% 98% 100%  Weight:      Height:        Intake/Output Summary (Last 24 hours) at 06/26/2017 1552 Last data filed at 06/26/2017 1300 Gross per 24 hour  Intake 1332.5 ml  Output 1 ml  Net 1331.5 ml   Filed Weights   06/21/17 0933 06/22/17 1211  Weight: 79.4 kg (175 lb) 79.4 kg (174 lb 16 oz)    Examination:  General exam: alert in no distress.  Respiratory system: CTA Cardiovascular system: S 1, S 2 RRR Gastrointestinal system: BS present, soft, nt Central nervous system: alert, non focal.  Extremities: right LE with clean dressing.  Skin: No rash    Data Reviewed: I have personally reviewed following labs and imaging studies  CBC: Recent Labs  Lab 06/21/17 1051 06/23/17 0706 06/24/17 0754 06/25/17 0551 06/26/17 0909  WBC 13.4* 7.2 6.4 4.5 5.6  NEUTROABS 12.4*  --   --   --   --   HGB 11.8* 8.3* 9.3* 9.6* 9.7*  HCT 37.5 25.6* 29.5* 30.1* 30.5*  MCV 90.1 88.9 89.7 90.1 90.8  PLT 243 174 186 196 220   Basic Metabolic Panel: Recent Labs  Lab 06/21/17 1051 06/23/17 0706 06/24/17 0754 06/25/17 0551 06/26/17 0909  NA 141 141 141  --  140  K 3.7 3.9 3.7  --  3.3*  CL 104 109 108  --  105  CO2 27 23 26   --  27  GLUCOSE 122* 120* 103*  --  127*  BUN 23* 24* 23*  --  12  CREATININE 0.60 0.68 0.55  --  0.60  CALCIUM 9.0 8.2* 8.3*  --  8.4*  MG 1.6*  --   --  1.9  --    GFR: Estimated Creatinine Clearance: 76 mL/min (by C-G formula based on SCr of 0.6 mg/dL). Liver Function Tests: Recent Labs  Lab 06/21/17 1051  AST 25  ALT 19    ALKPHOS 117  BILITOT 0.8  PROT 6.2*  ALBUMIN 3.5   Recent Labs  Lab 06/21/17 1051  LIPASE 27   No results for input(s): AMMONIA in the last 168 hours. Coagulation Profile: No results for input(s): INR, PROTIME in the last 168 hours. Cardiac Enzymes: Recent Labs  Lab 06/25/17 1245 06/25/17 1658 06/25/17 2247  TROPONINI <0.03 <0.03 <0.03   BNP (last 3 results) No results for input(s): PROBNP in the last 8760 hours. HbA1C: No results for input(s): HGBA1C in the last 72 hours. CBG: Recent Labs  Lab 06/21/17 0936 06/25/17 0721 06/25/17 1229  GLUCAP 97 79 84   Lipid Profile: No results for input(s): CHOL, HDL, LDLCALC, TRIG, CHOLHDL, LDLDIRECT in the last 72 hours. Thyroid Function Tests: No results for input(s): TSH,  T4TOTAL, FREET4, T3FREE, THYROIDAB in the last 72 hours. Anemia Panel: No results for input(s): VITAMINB12, FOLATE, FERRITIN, TIBC, IRON, RETICCTPCT in the last 72 hours. Sepsis Labs: No results for input(s): PROCALCITON, LATICACIDVEN in the last 168 hours.  Recent Results (from the past 240 hour(s))  Surgical pcr screen     Status: None   Collection Time: 06/22/17 12:51 AM  Result Value Ref Range Status   MRSA, PCR NEGATIVE NEGATIVE Final   Staphylococcus aureus NEGATIVE NEGATIVE Final    Comment: (NOTE) The Xpert SA Assay (FDA approved for NASAL specimens in patients 66 years of age and older), is one component of a comprehensive surveillance program. It is not intended to diagnose infection nor to guide or monitor treatment.          Radiology Studies: No results found.      Scheduled Meds: . aspirin  81 mg Oral Daily  . cholecalciferol  1,000 Units Oral QHS  . enoxaparin (LOVENOX) injection  40 mg Subcutaneous Q24H  . Fingolimod HCl  0.5 mg Oral Daily  . pantoprazole  40 mg Oral Daily  . polyethylene glycol  17 g Oral BID  . senna-docusate  1 tablet Oral BID   Continuous Infusions: . sodium chloride 50 mL/hr at 06/25/17 1309   . methocarbamol (ROBAXIN)  IV       LOS: 5 days    Time spent: 35 minutes.     Alba Cory, MD [facility] Pager [phone]  If 7PM-7AM, please contact night-coverage [email or web address] Password [facility] 06/26/2017, 3:52 PM

## 2017-06-26 NOTE — Progress Notes (Signed)
Progress Note  Patient Name: Tiffany Barnes Date of Encounter: 06/26/2017  Primary Cardiologist:   Olga Millers, MD   Subjective   No palpitations or presyncope  Inpatient Medications    Scheduled Meds: . aspirin  81 mg Oral Daily  . cholecalciferol  1,000 Units Oral QHS  . enoxaparin (LOVENOX) injection  40 mg Subcutaneous Q24H  . Fingolimod HCl  0.5 mg Oral Daily  . pantoprazole  40 mg Oral Daily  . polyethylene glycol  17 g Oral BID  . potassium chloride  40 mEq Oral Once  . senna-docusate  1 tablet Oral BID   Continuous Infusions: . sodium chloride 50 mL/hr at 06/25/17 1309  . methocarbamol (ROBAXIN)  IV     PRN Meds: acetaminophen **OR** acetaminophen, ALPRAZolam, hydrALAZINE, HYDROcodone-acetaminophen, ketorolac, methocarbamol **OR** methocarbamol (ROBAXIN)  IV, ondansetron (ZOFRAN) IV   Vital Signs    Vitals:   06/25/17 1534 06/26/17 0405 06/26/17 0743 06/26/17 1300  BP:  (!) 157/63 100/73 (!) 132/59  Pulse: 63 70 80 71  Resp:   20 18  Temp: 98.2 F (36.8 C) 98.4 F (36.9 C) 97.6 F (36.4 C) 98.2 F (36.8 C)  TempSrc: Oral Oral Oral Oral  SpO2: 98% 98% 98% 100%  Weight:      Height:        Intake/Output Summary (Last 24 hours) at 06/26/2017 1344 Last data filed at 06/26/2017 1300 Gross per 24 hour  Intake 1332.5 ml  Output 1 ml  Net 1331.5 ml   Filed Weights   06/21/17 0933 06/22/17 1211  Weight: 175 lb (79.4 kg) 174 lb 16 oz (79.4 kg)    Telemetry    NSR, one PVC couplet - Personally Reviewed  ECG    NA - Personally Reviewed   Labs    Chemistry Recent Labs  Lab 06/21/17 1051 06/23/17 0706 06/24/17 0754 06/26/17 0909  NA 141 141 141 140  K 3.7 3.9 3.7 3.3*  CL 104 109 108 105  CO2 27 23 26 27   GLUCOSE 122* 120* 103* 127*  BUN 23* 24* 23* 12  CREATININE 0.60 0.68 0.55 0.60  CALCIUM 9.0 8.2* 8.3* 8.4*  PROT 6.2*  --   --   --   ALBUMIN 3.5  --   --   --   AST 25  --   --   --   ALT 19  --   --   --   ALKPHOS 117  --   --    --   BILITOT 0.8  --   --   --   GFRNONAA >60 >60 >60 >60  GFRAA >60 >60 >60 >60  ANIONGAP 10 9 7 8      Hematology Recent Labs  Lab 06/24/17 0754 06/25/17 0551 06/26/17 0909  WBC 6.4 4.5 5.6  RBC 3.29* 3.34* 3.36*  HGB 9.3* 9.6* 9.7*  HCT 29.5* 30.1* 30.5*  MCV 89.7 90.1 90.8  MCH 28.3 28.7 28.9  MCHC 31.5 31.9 31.8  RDW 14.5 14.5 14.7  PLT 186 196 220    Cardiac Enzymes Recent Labs  Lab 06/25/17 1245 06/25/17 1658 06/25/17 2247  TROPONINI <0.03 <0.03 <0.03    Recent Labs  Lab 06/21/17 1251  TROPIPOC 0.03     BNPNo results for input(s): BNP, PROBNP in the last 168 hours.   DDimer No results for input(s): DDIMER in the last 168 hours.   Radiology    No results found.  Cardiac Studies   ECHO 06/22/17  Study  Conclusions  - Left ventricle: The cavity size was normal. There was mild   concentric hypertrophy. Systolic function was normal. The   estimated ejection fraction was in the range of 60% to 65%. Wall   motion was normal; there were no regional wall motion   abnormalities. Doppler parameters are consistent with abnormal   left ventricular relaxation (grade 1 diastolic dysfunction).   Doppler parameters are consistent with indeterminate ventricular   filling pressure. - Aortic valve: Transvalvular velocity was within the normal range.   There was no stenosis. There was mild regurgitation. - Mitral valve: Transvalvular velocity was within the normal range.   There was no evidence for stenosis. There was trivial   regurgitation. - Right ventricle: The cavity size was normal. Wall thickness was   normal. Systolic function was normal. - Atrial septum: No defect or patent foramen ovale was identified   by color flow Doppler. - Tricuspid valve: There was no regurgitation.  Patient Profile     62 y.o. female with MS, knee OA, HTN, prior h/o syncope and history of low baseline resting HR but no other prior cardiac history, admitted for syncope  resulting in fall and subsequent right supracondylar femoral fracture, s/p surgical repair 1/29. She is being seen for syncope at the request of Dr Sunnie Nielsen, Internal Medicine.   Assessment & Plan    SYNCOPE:    Tele reviewed and discussed with patient.  No events.  We have arranged out patient follow up.  She can remain on tele during the remainder of this admission.  Primary team please call us with event arrhythmias or further questions.  Otherwise we will sign off.   For questions or updates, please contact [facility] Please consult [email or web address] for contact info under Cardiology/STEMI.   Signed, Rollene Rotunda, MD  06/26/2017, 1:44 PM

## 2017-06-26 NOTE — Progress Notes (Signed)
PT Cancellation Note  Patient Details Name: Tiffany Barnes MRN: [id number] DOB: [birth year]   Cancelled Treatment:    Reason Eval/Treat Not Completed: Patient declined, no reason specified Attempted to work with patient at 1500,  Patient with +8 family members visiting and asks that I come back later today after they leave. Will re-attempt as time permits today.   Etta Grandchild, PT, DPT Acute Rehab Services Pager: [phone]     Etta Grandchild 06/26/2017, 3:12 PM

## 2017-06-27 ENCOUNTER — Encounter (HOSPITAL_COMMUNITY): Payer: Self-pay | Admitting: *Deleted

## 2017-06-27 LAB — BASIC METABOLIC PANEL
Anion gap: 10 (ref 5–15)
BUN: 9 mg/dL (ref 6–20)
CALCIUM: 8.3 mg/dL — AB (ref 8.9–10.3)
CHLORIDE: 106 mmol/L (ref 101–111)
CO2: 24 mmol/L (ref 22–32)
CREATININE: 0.55 mg/dL (ref 0.44–1.00)
GFR calc non Af Amer: >60 mL/min (ref >60)
GLUCOSE: 92 mg/dL (ref 65–99)
Potassium: 3.9 mmol/L (ref 3.5–5.1)
Sodium: 140 mmol/L (ref 135–145)

## 2017-06-27 MED ORDER — LOPERAMIDE HCL 2 MG PO CAPS
4.0000 mg | ORAL_CAPSULE | ORAL | Status: DC | PRN
  Administered 2017-06-27: 4 mg via ORAL
  Filled 2017-06-27: qty 2

## 2017-06-27 NOTE — Progress Notes (Signed)
PROGRESS NOTE    Tiffany Barnes  ZOX:[id number] DOB: 09/24/1955 DOA: 06/21/2017 PCP: Tiffany Banner, MD    Brief Narrative:  Tiffany Barnes is a 62 y.o. female with a history of MS and chronic knee OA who presented to the ED at [facility] after falling at home. She describes an abrupt hot sensation rising into her face followed by lightheadedness and reported LOC, woke up on the ground with right leg pain above the knee where she had landed. She has been having very similar episodes, specifically while cooking over the stove, but usually can make it to seated position prior to passing out, improved with cool cloth. No seizure-like activity reported. No chest pain, dyspnea, palpitations.   Vitals were stable. Blood work showed erosive 13.4 K, hemoglobin of 11.8, normal chemistry. EKG shows sinus rhythm at 60 with incomplete LBBB (new since last EKG in the system from 2014) An x-ray of the hip was normal. X-ray of the knee showed mildly comminuted distal femoral shaft fracture with mild displacement. Chest x-ray was unremarkable. She was found to have a laceration over the anterior knee which was sutured by ED physician and given a dose of IV Ancef. Orthopedic surgery was consulted and recommended transfer to [facility] for repair 1/29 by Tiffany Barnes. Echocardiogram was performed showing preserved biventricular function with grade 1 diastolic dysfunction, no wall motion abnormalities or significant valvular disease.     Assessment & Plan:   Principal Problem:   Fracture of femoral shaft, right, closed (HCC) Active Problems:   Syncope   Closed comminuted supracondylar fracture of femur, right, initial encounter (HCC)   Femur fracture, right (HCC)   Degenerative arthritis of right knee  1-Right supracondylar femoral fracture:  -Tiffany Barnes S/P ; ORIF supracondylar right distal femur fracture -Intermediate repair knee laceration  -Pt per ortho.  -Bowel regimen, DVT prophylaxis per ortho.  -Had BM after  suppository.   2-Acute Blood loss anemia;  Post sx expected.  Hb drop from 11 to 8.  transfuse one unit PRBC> 1-30.  Hb stable at 9.6. Stable.    3-Syncope; Vaso-vagal.  ECHO normal EF, no wall motion abnormalities.  EKG today sinus rhythm.  Near syncope episode in the hospital.  Resume home dose xanax PRN.  Will ask PT today to check orthostatic vitals.  Follow up out patient with cardiology   Constipation; resolved, now with diarrhea.  Dulcolax suppository  Had BM.   Diarrhea; will increase IV fluids. And will give imodium PRN.   Hypomagnesemia; replaced.   Chronic diastolic CHF and incomplete LBBB: Euvolemic at this time. EF 60-65%, G1DD, no WMA's.   Multiple sclerosis: Since the 1990's. Symptoms stable.  - Continue outpatient follow up with Tiffany Barnes  HTN: Chronic, stable.  - Continue losartan - Monitor BP postop. -PRN hydralazine.   GERD: Chronic, stable - Ok to continue PPI    DVT prophylaxis: Lovenox Code Status: full code.  Family Communication: husband who was at bedside.  Disposition Plan: to be determine.   Consultants:   Ortho    Procedures:  Echocardiogram 06/22/2017: - Left ventricle: The cavity size was normal. There was mild concentric hypertrophy. Systolic function was normal. The estimated ejection fraction was in the range of 60% to 65%. Wall motion was normal; there were no regional wall motion abnormalities. Doppler parameters are consistent with abnormal left ventricular relaxation (grade 1 diastolic dysfunction). Doppler parameters are consistent with indeterminate ventricular filling pressure. - Aortic valve: Transvalvular velocity was within the normal  range. There was no stenosis. There was mild regurgitation. - Mitral valve: Transvalvular velocity was within the normal range. There was no evidence for stenosis. There was trivial regurgitation. - Right ventricle: The cavity size was normal. Wall  thickness was normal. Systolic function was normal. - Atrial septum: No defect or patent foramen ovale was identified by color flow Doppler. - Tricuspid valve: There was no regurgitation.       Antimicrobials:  Ancef pre op/    Subjective: She is feeling ok, now report diarrhea.    Objective: Vitals:   06/26/17 1300 06/26/17 1923 06/27/17 0437 06/27/17 0710  BP: (!) 132/59 (!) 147/58 (!) 188/68 (!) 147/60  Pulse: 71 70 70   Resp: 18 16 16    Temp: 98.2 F (36.8 C) 98.3 F (36.8 C) 97.9 F (36.6 C)   TempSrc: Oral Oral Oral   SpO2: 100% 98% 98%   Weight:      Height:        Intake/Output Summary (Last 24 hours) at 06/27/2017 1324 Last data filed at 06/27/2017 7322 Gross per 24 hour  Intake 1060 ml  Output 1050 ml  Net 10 ml   Filed Weights   06/21/17 0933 06/22/17 1211  Weight: 79.4 kg (175 lb) 79.4 kg (174 lb 16 oz)    Examination:  General exam: NAD Respiratory system: CTA Cardiovascular system: S 1, S 2 RRR Gastrointestinal system: BS present, soft, nt Central nervous system: non focal.  Extremities: right leg with incision healing.  Skin: No rash    Data Reviewed: I have personally reviewed following labs and imaging studies  CBC: Recent Labs  Lab 06/21/17 1051 06/23/17 0706 06/24/17 0754 06/25/17 0551 06/26/17 0909  WBC 13.4* 7.2 6.4 4.5 5.6  NEUTROABS 12.4*  --   --   --   --   HGB 11.8* 8.3* 9.3* 9.6* 9.7*  HCT 37.5 25.6* 29.5* 30.1* 30.5*  MCV 90.1 88.9 89.7 90.1 90.8  PLT 243 174 186 196 220   Basic Metabolic Panel: Recent Labs  Lab 06/21/17 1051 06/23/17 0706 06/24/17 0754 06/25/17 0551 06/26/17 0909  NA 141 141 141  --  140  K 3.7 3.9 3.7  --  3.3*  CL 104 109 108  --  105  CO2 27 23 26   --  27  GLUCOSE 122* 120* 103*  --  127*  BUN 23* 24* 23*  --  12  CREATININE 0.60 0.68 0.55  --  0.60  CALCIUM 9.0 8.2* 8.3*  --  8.4*  MG 1.6*  --   --  1.9  --    GFR: Estimated Creatinine Clearance: 76 mL/min (by C-G formula  based on SCr of 0.6 mg/dL). Liver Function Tests: Recent Labs  Lab 06/21/17 1051  AST 25  ALT 19  ALKPHOS 117  BILITOT 0.8  PROT 6.2*  ALBUMIN 3.5   Recent Labs  Lab 06/21/17 1051  LIPASE 27   No results for input(s): AMMONIA in the last 168 hours. Coagulation Profile: No results for input(s): INR, PROTIME in the last 168 hours. Cardiac Enzymes: Recent Labs  Lab 06/25/17 1245 06/25/17 1658 06/25/17 2247  TROPONINI <0.03 <0.03 <0.03   BNP (last 3 results) No results for input(s): PROBNP in the last 8760 hours. HbA1C: No results for input(s): HGBA1C in the last 72 hours. CBG: Recent Labs  Lab 06/21/17 0936 06/25/17 0721 06/25/17 1229  GLUCAP 97 79 84   Lipid Profile: No results for input(s): CHOL, HDL, LDLCALC, TRIG, CHOLHDL, LDLDIRECT  in the last 72 hours. Thyroid Function Tests: No results for input(s): TSH, T4TOTAL, FREET4, T3FREE, THYROIDAB in the last 72 hours. Anemia Panel: No results for input(s): VITAMINB12, FOLATE, FERRITIN, TIBC, IRON, RETICCTPCT in the last 72 hours. Sepsis Labs: No results for input(s): PROCALCITON, LATICACIDVEN in the last 168 hours.  Recent Results (from the past 240 hour(s))  Surgical pcr screen     Status: None   Collection Time: 06/22/17 12:51 AM  Result Value Ref Range Status   MRSA, PCR NEGATIVE NEGATIVE Final   Staphylococcus aureus NEGATIVE NEGATIVE Final    Comment: (NOTE) The Xpert SA Assay (FDA approved for NASAL specimens in patients 56 years of age and older), is one component of a comprehensive surveillance program. It is not intended to diagnose infection nor to guide or monitor treatment.          Radiology Studies: No results found.      Scheduled Meds: . aspirin  81 mg Oral Daily  . cholecalciferol  1,000 Units Oral QHS  . enoxaparin (LOVENOX) injection  40 mg Subcutaneous Q24H  . ferrous sulfate  325 mg Oral BID WC  . Fingolimod HCl  0.5 mg Oral Daily  . pantoprazole  40 mg Oral Daily    Continuous Infusions: . sodium chloride 100 mL/hr at 06/27/17 1122  . methocarbamol (ROBAXIN)  IV       LOS: 6 days    Time spent: 35 minutes.     Tiffany Cory, MD [facility] Pager [phone]  If 7PM-7AM, please contact night-coverage [email or web address] Password [email or web address] 06/27/2017, 1:24 PM

## 2017-06-27 NOTE — Progress Notes (Signed)
Physical Therapy Treatment Patient Details Name: Tiffany Barnes MRN: [id number] DOB: Oct 04, 1955 Today's Date: 06/27/2017    History of Present Illness R distal femur fx, s/p ORIF. PMH of MS    PT Comments    Patient premedicated prior to session (anxiety and pain medications). Patient was able to stand X2 with use of Stedy and mod/max A +2. Maximove required to return pt to bed from [facility] as she was unable to stand from lower surface with +2 assist. Pt did demonstrate improved bed mobility and reported less pain in R LE this session. Continue to progress as tolerated.   Orthostatic BP taken during session as follows: Supine 147/75 (94) pulse 72 Sitting 127/111 (115) pulse 74 Standing 150/72 (92) pulse 104   Follow Up Recommendations  CIR;Supervision/Assistance - 24 hour     Equipment Recommendations  None recommended by PT    Recommendations for Other Services Rehab consult     Precautions / Restrictions Precautions Precautions: Fall Precaution Comments: 1 fall just prior to admission, pt denies other falls in past 1 year, but has had several episodes of near syncope Restrictions Weight Bearing Restrictions: Yes RLE Weight Bearing: Weight bearing as tolerated    Mobility  Bed Mobility Overal bed mobility: Needs Assistance Bed Mobility: Supine to Sit;Sit to Supine     Supine to sit: Mod assist Sit to supine: Total assist(used maximove )   General bed mobility comments: assist to bring bilat Le to EOB and to elevate trunk into sitting; cues for sequencing  Transfers Overall transfer level: Needs assistance   Transfers: Sit to/from Stand Sit to Stand: Mod assist;+2 physical assistance;Max assist         General transfer comment: max A +2 to stand from EOB with use of Stedy then mod A +2 to achieve upright posture standing from Stedy frame; pt unable to stand from [facility] with use of STedy and max A +2 despite max multimodal cues; maximove utilized to return pt to bed  from [facility]   Ambulation/Gait                 Stairs            Wheelchair Mobility    Modified Rankin (Stroke Patients Only)       Balance Overall balance assessment: Needs assistance Sitting-balance support: Feet supported;No upper extremity supported Sitting balance-Leahy Scale: Fair     Standing balance support: Bilateral upper extremity supported Standing balance-Leahy Scale: Zero                              Cognition Arousal/Alertness: Awake/alert Behavior During Therapy: WFL for tasks assessed/performed;Anxious Overall Cognitive Status: Within Functional Limits for tasks assessed                                        Exercises      General Comments General comments (skin integrity, edema, etc.): husband present during session      Pertinent Vitals/Pain Pain Assessment: Faces Faces Pain Scale: Hurts little more Pain Location: RLE Pain Descriptors / Indicators: Sore;Grimacing;Guarding Pain Intervention(s): Limited activity within patient's tolerance;Monitored during session;Premedicated before session;Repositioned    Home Living                      Prior Function  PT Goals (current goals can now be found in the care plan section) Acute Rehab PT Goals PT Goal Formulation: With patient/family Time For Goal Achievement: 07/07/17 Potential to Achieve Goals: Fair Progress towards PT goals: Progressing toward goals    Frequency    Min 4X/week      PT Plan Current plan remains appropriate    Co-evaluation              AM-PAC PT "6 Clicks" Daily Activity  Outcome Measure  Difficulty turning over in bed (including adjusting bedclothes, sheets and blankets)?: Unable Difficulty moving from lying on back to sitting on the side of the bed? : Unable Difficulty sitting down on and standing up from a chair with arms (e.g., wheelchair, bedside commode, etc,.)?: Unable Help needed moving to  and from a bed to chair (including a wheelchair)?: A Lot Help needed walking in hospital room?: Total Help needed climbing 3-5 steps with a railing? : Total 6 Click Score: 7    End of Session Equipment Utilized During Treatment: Gait belt Activity Tolerance: Patient tolerated treatment well(pt limited by anxiety) Patient left: with call bell/phone within reach;in bed;with family/visitor present Nurse Communication: Mobility status;Need for lift equipment PT Visit Diagnosis: Unsteadiness on feet (R26.81);History of falling (Z91.81);Difficulty in walking, not elsewhere classified (R26.2);Muscle weakness (generalized) (M62.81);Pain Pain - Right/Left: Right Pain - part of body: Knee     Time: 1340-1436 PT Time Calculation (min) (ACUTE ONLY): 56 min  Charges:  $Therapeutic Activity: 53-67 mins                    G Codes:       Erline Levine, PTA Pager: [phone]     Carolynne Edouard 06/27/2017, 2:59 PM

## 2017-06-27 NOTE — Social Work (Signed)
Pt has a SNF bed at [facility]. SNF obtained auth however SNF does not take new admission on the weekend.  CSW will f/u for discharge tomorrow if medically ready.  CSW will continue to follow.  Keene Breath, LCSW Clinical Social Worker- Weekend [phone]

## 2017-06-27 NOTE — Progress Notes (Signed)
Pt asking for Cdiff test and or Lomotil for frequent stools over night.  Thank you

## 2017-06-28 DIAGNOSIS — R197 Diarrhea, unspecified: Secondary | ICD-10-CM

## 2017-06-28 LAB — CREATININE, SERUM
CREATININE: 0.52 mg/dL (ref 0.44–1.00)
GFR calc Af Amer: >60 mL/min (ref >60)

## 2017-06-28 MED ORDER — ALPRAZOLAM 0.25 MG PO TABS: 0.2500 mg | ORAL_TABLET | Freq: Two times a day (BID) | ORAL | 0 refills | Status: DC | PRN

## 2017-06-28 MED ORDER — FERROUS SULFATE 325 (65 FE) MG PO TABS: 325.0000 mg | ORAL_TABLET | Freq: Two times a day (BID) | ORAL | 3 refills | Status: DC

## 2017-06-28 NOTE — Social Work (Signed)
Clinical Social Worker facilitated patient discharge including contacting patient family and facility to confirm patient discharge plans.  Clinical information faxed to facility and family agreeable with plan.    CSW arranged ambulance transport via [facility] to [facility] at 1:00pm.    RN to call [phone] to give report prior to discharge.  Clinical Social Worker will sign off for now as social work intervention is no longer needed. Please consult [place] again if new need arises.  Keene Breath, LCSW Clinical Social Worker [phone]

## 2017-06-28 NOTE — Discharge Summary (Signed)
.  Physician Discharge Summary  ZIONNA HOMEWOOD ZOX:[id number] DOB: September 05, 1955 DOA: 06/21/2017  PCP: Barbie Banner, MD  Admit date: 06/21/2017 Discharge date: 06/28/2017  Admitted From: Home  Disposition: Home   Recommendations for Outpatient Follow-up:  1. Follow up with PCP in 1-2 weeks 2. Please obtain BMP/CBC in one week 3. Repeat Hb.  4. Follow up with cardiology for further evaluation of syncope.  5.    Discharge Condition: stable.  CODE STATUS: full code.  Diet recommendation: Heart Healthy   Brief/Interim Summary: Brief Narrative:  Blakelyn Dinges Smithis a61 y.o.femalewith a history of MS and chronic knee OA who presented to the ED at [facility] after falling at home. She describes an abrupt hot sensation rising into her face followed by lightheadedness and reported LOC, woke up on the ground with right leg pain above the knee where she had landed. She has been having very similar episodes, specifically while cooking over the stove, but usually can make it to seated position prior to passing out, improved with cool cloth. No seizure-like activity reported. No chest pain, dyspnea, palpitations.  Vitals were stable. Blood work showed erosive 13.4 K, hemoglobin of 11.8, normal chemistry. EKG shows sinus rhythm at 60 with incomplete LBBB (new since last EKG in the system from 2014) An x-ray of the hip was normal. X-ray of the knee showed mildly comminuted distal femoral shaft fracture with mild displacement. Chest x-ray was unremarkable. She was found to have a laceration over the anterior knee which was sutured by ED physician and given a dose of IV Ancef.Orthopedic surgery was consulted and recommended transfer to [facility] for repair 1/29 by Dr. Jena Gauss. Echocardiogram was performed showing preserved biventricular function with grade 1 diastolic dysfunction, no wall motion abnormalities or significant valvular disease.    Assessment & Plan:   Principal Problem:   Fracture of femoral  shaft, right, closed (HCC) Active Problems:   Syncope   Closed comminuted supracondylar fracture of femur, right, initial encounter (HCC)   Femur fracture, right (HCC)   Degenerative arthritis of right knee  1-Right supracondylar femoral fracture:  -Dr. Jena Gauss S/P ; ORIF supracondylar right distal femur fracture -Intermediate repair knee laceration -Pt per ortho.  -Bowel regimen, DVT prophylaxis per ortho. Lovenox.  -Had BM after suppository.   2-Acute Blood loss anemia;  Post sx expected.  Hb drop from 11 to 8.  transfuse one unit PRBC> 1-30.  Hb stable at 9.6. Stable.  Started on ferrous sulfate.   3-Syncope; Vaso-vagal.  ECHO normal EF, no wall motion abnormalities.  EKG today sinus rhythm.  Near syncope episode in the hospital.  Resume home dose xanax PRN.  Orthostatic negative by BP, less likely autonomic dysfunction. Hr was elevated. Will repeat HR , patient received IV fluids.  Follow up out patient with cardiology   Constipation; resolved, now with diarrhea.  Dulcolax suppository  Had BM.   Diarrhea; resolved with imodium.   Hypomagnesemia; replaced.   Chronic diastolic CHF and incomplete LBBB: Euvolemic at this time. EF 60-65%, G1DD, no WMA's.   Multiple sclerosis: Since the 1990's. Symptoms stable.  - Continue outpatient follow up with Dr. Marjory Lies  HTN: Chronic, stable.  - resume cozaar if BP increases.  - Monitor BP postop. -PRN hydralazine.   GERD: Chronic, stable - Ok to continue PPI    Discharge Diagnoses:  Principal Problem:   Fracture of femoral shaft, right, closed (HCC) Active Problems:   Syncope   Closed comminuted supracondylar fracture of femur, right, initial  encounter [facility])   Femur fracture, right (HCC)   Degenerative arthritis of right knee    Discharge Instructions  Discharge Instructions    Diet - low sodium heart healthy   Complete by:  As directed    Increase activity slowly   Complete by:  As directed       Allergies as of 06/28/2017      Reactions   Nitrofurantoin Other (See Comments)   Unknown, "Macrobid- was years ago"      Medication List    STOP taking these medications   losartan 50 MG tablet Commonly known as:  COZAAR   naproxen sodium 220 MG tablet Commonly known as:  ALEVE     TAKE these medications   acetaminophen 500 MG tablet Commonly known as:  TYLENOL Take 1,000 mg by mouth every 8 (eight) hours as needed for mild pain, moderate pain, fever or headache. As needed   ALPRAZolam 0.25 MG tablet Commonly known as:  XANAX Take 1 tablet (0.25 mg total) by mouth 2 (two) times daily as needed for anxiety.   aspirin 81 MG tablet Take 81 mg by mouth daily.   cholecalciferol 1000 units tablet Commonly known as:  VITAMIN D Take 1,000 Units by mouth at bedtime.   enoxaparin 40 MG/0.4ML injection Commonly known as:  LOVENOX Inject 0.4 mLs (40 mg total) into the skin daily.   ferrous sulfate 325 (65 FE) MG tablet Take 1 tablet (325 mg total) by mouth 2 (two) times daily with a meal.   Fingolimod HCl 0.5 MG Caps Commonly known as:  GILENYA Take 1 capsule (0.5 mg total) by mouth daily.   HYDROcodone-acetaminophen 5-325 MG tablet Commonly known as:  NORCO/VICODIN Take 1-2 tablets by mouth every 6 (six) hours as needed for moderate pain.   omeprazole 20 MG capsule Commonly known as:  PRILOSEC Take 20 mg by mouth at bedtime.      Follow-up Information    Haddix, Gillie Manners, MD. Schedule an appointment as soon as possible for a visit in 2 week(s).   Specialty:  Orthopedic Surgery Contact information: [address] [place] [address] [phone]        Jodelle Gross, NP Follow up on 07/21/2017.   Specialties:  Nurse Practitioner, Radiology, Cardiology Why:  8:00 Contact information: [address] [place] [address] [phone]          Allergies  Allergen Reactions  . Nitrofurantoin Other (See Comments)    Unknown,  "Macrobid- was years ago"    Consultations:  Cardiology   Ortho    Procedures/Studies: Dg Chest 1 View  Result Date: 06/21/2017 CLINICAL DATA:  Syncope EXAM: CHEST 1 VIEW COMPARISON:  09/09/2007 FINDINGS: Lungs are clear.  No pleural effusion or pneumothorax. The heart is normal in size. Cholecystectomy clips. IMPRESSION: No active disease. Electronically Signed   By: Charline Bills M.D.   On: 06/21/2017 11:52   Dg Knee 2 Views Right  Result Date: 06/21/2017 CLINICAL DATA:  Fall, right leg pain EXAM: RIGHT KNEE - 1-2 VIEW COMPARISON:  None. FINDINGS: Mildly comminuted distal femoral shaft fracture. Approximately 1/2 shaft width posterior/lateral displacement of the knee relative to the femoral shaft. Suspected suprapatellar effusion/hemorrhage. Mild to moderate tricompartmental degenerative changes. IMPRESSION: Mildly comminuted distal femoral shaft fracture with mild displacement, as above. Electronically Signed   By: Charline Bills M.D.   On: 06/21/2017 11:52   Ct Knee Right Wo Contrast  Result Date: 06/21/2017 CLINICAL DATA:  Syncopal  episode.  Fell.  Evaluate fractured femur. EXAM: CT OF THE right KNEE WITHOUT CONTRAST TECHNIQUE: Multidetector CT imaging of the right knee was performed according to the standard protocol. Multiplanar CT image reconstructions were also generated. COMPARISON:  Radiographs 06/21/2017 FINDINGS: Complex comminuted displaced supracondylar femur fracture with 1 shaft width of posterior and medial displacement. Associated lipohemarthrosis with some areas of fatty marrow in the joint. The tibia is intact. No fibula or patella fractures. Patella Alta and wavy patellar tendon but I do not see an obvious ruptured quadriceps tendon. Air in the subcutaneous tissues may suggest an open fracture. Severe degenerative changes involving the knee joint with joint space narrowing, spurring and subchondral cystic change. This is most pronounced in the lateral compartment.  IMPRESSION: Complex comminuted displaced supracondylar femur fracture as described above. No acute fracture involving the tibia, fibula or patella. Electronically Signed   By: Rudie Meyer M.D.   On: 06/21/2017 15:54   Dg Knee Complete 4 Views Right  Result Date: 06/22/2017 CLINICAL DATA:  Status post open reduction and internal fixation of distal right femur. EXAM: DG C-ARM 61-120 MIN; RIGHT KNEE - COMPLETE 4+ VIEW FLUOROSCOPY TIME:  1 minute 30 seconds. COMPARISON:  Radiographs of June 21, 2017. FINDINGS: Six intraoperative fluoroscopic images of the right femur and knee demonstrate surgical internal fixation of distal right femoral fracture. Good alignment of fracture components is noted. IMPRESSION: Status post surgical internal fixation of distal right femoral fracture with good alignment of fracture components. Electronically Signed   By: Lupita Raider, M.D.   On: 06/22/2017 15:55   Dg C-arm 1-60 Min  Result Date: 06/22/2017 CLINICAL DATA:  Status post open reduction and internal fixation of distal right femur. EXAM: DG C-ARM 61-120 MIN; RIGHT KNEE - COMPLETE 4+ VIEW FLUOROSCOPY TIME:  1 minute 30 seconds. COMPARISON:  Radiographs of June 21, 2017. FINDINGS: Six intraoperative fluoroscopic images of the right femur and knee demonstrate surgical internal fixation of distal right femoral fracture. Good alignment of fracture components is noted. IMPRESSION: Status post surgical internal fixation of distal right femoral fracture with good alignment of fracture components. Electronically Signed   By: Lupita Raider, M.D.   On: 06/22/2017 15:55   Dg C-arm 1-60 Min  Result Date: 06/22/2017 CLINICAL DATA:  Status post open reduction and internal fixation of distal right femur. EXAM: DG C-ARM 61-120 MIN; RIGHT KNEE - COMPLETE 4+ VIEW FLUOROSCOPY TIME:  1 minute 30 seconds. COMPARISON:  Radiographs of June 21, 2017. FINDINGS: Six intraoperative fluoroscopic images of the right femur and knee  demonstrate surgical internal fixation of distal right femoral fracture. Good alignment of fracture components is noted. IMPRESSION: Status post surgical internal fixation of distal right femoral fracture with good alignment of fracture components. Electronically Signed   By: Lupita Raider, M.D.   On: 06/22/2017 15:55   Dg Hip Unilat W Or Wo Pelvis 2-3 Views Right  Result Date: 06/21/2017 CLINICAL DATA:  Right hip pain after fall today. EXAM: DG HIP (WITH OR WITHOUT PELVIS) 2-3V RIGHT COMPARISON:  None. FINDINGS: There is no evidence of hip fracture or dislocation. There is no evidence of arthropathy or other focal bone abnormality. IMPRESSION: Normal right hip. Electronically Signed   By: Lupita Raider, M.D.   On: 06/21/2017 11:52   Dg Femur Port, Min 2 Views Right  Result Date: 06/22/2017 CLINICAL DATA:  62 year old female with comminuted distal right femur condylar fracture. Subsequent encounter. EXAM: RIGHT FEMUR PORTABLE 2 VIEW COMPARISON:  06/21/2017. FINDINGS: Post open reduction and internal fixation of comminuted distal right femoral condyle fracture utilizing sideplate and screws with much better alignment of fracture fragments. Baseline degenerative changes most notable lateral tibiofemoral joint space and patellofemoral joint space. IMPRESSION: Open reduction and internal fixation of comminuted distal right femur condylar fracture. Electronically Signed   By: Lacy Duverney M.D.   On: 06/22/2017 17:35       Subjective: She is feeling better, she denies lightheaded on standing yesterday , just nervous.  Diarrhea resolved.   Discharge Exam: Vitals:   06/27/17 2049 06/28/17 0440  BP: 140/70 140/60  Pulse: 72 65  Resp: 18 18  Temp: 98.7 F (37.1 C) 98.3 F (36.8 C)  SpO2: 95% 98%   Vitals:   06/27/17 1442 06/27/17 1445 06/27/17 2049 06/28/17 0440  BP: (!) 150/72 138/73 140/70 140/60  Pulse: (!) 104 72 72 65  Resp:  18 18 18   Temp:  97.9 F (36.6 C) 98.7 F (37.1 C) 98.3  F (36.8 C)  TempSrc:  Oral Oral Oral  SpO2:  97% 95% 98%  Weight:      Height:        General: Pt is alert, awake, not in acute distress Cardiovascular: RRR, S1/S2 +, no rubs, no gallops Respiratory: CTA bilaterally, no wheezing, no rhonchi Abdominal: Soft, NT, ND, bowel sounds + Extremities: no edema, no cyanosis. Incision healing.     The results of significant diagnostics from this hospitalization (including imaging, microbiology, ancillary and laboratory) are listed below for reference.     Microbiology: Recent Results (from the past 240 hour(s))  Surgical pcr screen     Status: None   Collection Time: 06/22/17 12:51 AM  Result Value Ref Range Status   MRSA, PCR NEGATIVE NEGATIVE Final   Staphylococcus aureus NEGATIVE NEGATIVE Final    Comment: (NOTE) The Xpert SA Assay (FDA approved for NASAL specimens in patients 58 years of age and older), is one component of a comprehensive surveillance program. It is not intended to diagnose infection nor to guide or monitor treatment.      Labs: BNP (last 3 results) No results for input(s): BNP in the last 8760 hours. Basic Metabolic Panel: Recent Labs  Lab 06/21/17 1051 06/23/17 0706 06/24/17 0754 06/25/17 0551 06/26/17 0909 06/27/17 1201  NA 141 141 141  --  140 140  K 3.7 3.9 3.7  --  3.3* 3.9  CL 104 109 108  --  105 106  CO2 27 23 26   --  27 24  GLUCOSE 122* 120* 103*  --  127* 92  BUN 23* 24* 23*  --  12 9  CREATININE 0.60 0.68 0.55  --  0.60 0.55  CALCIUM 9.0 8.2* 8.3*  --  8.4* 8.3*  MG 1.6*  --   --  1.9  --   --    Liver Function Tests: Recent Labs  Lab 06/21/17 1051  AST 25  ALT 19  ALKPHOS 117  BILITOT 0.8  PROT 6.2*  ALBUMIN 3.5   Recent Labs  Lab 06/21/17 1051  LIPASE 27   No results for input(s): AMMONIA in the last 168 hours. CBC: Recent Labs  Lab 06/21/17 1051 06/23/17 0706 06/24/17 0754 06/25/17 0551 06/26/17 0909  WBC 13.4* 7.2 6.4 4.5 5.6  NEUTROABS 12.4*  --   --   --    --   HGB 11.8* 8.3* 9.3* 9.6* 9.7*  HCT 37.5 25.6* 29.5* 30.1* 30.5*  MCV 90.1 88.9 89.7 90.1  90.8  PLT 243 174 186 196 220   Cardiac Enzymes: Recent Labs  Lab 06/25/17 1245 06/25/17 1658 06/25/17 2247  TROPONINI <0.03 <0.03 <0.03   BNP: Invalid input(s): POCBNP CBG: Recent Labs  Lab 06/21/17 0936 06/25/17 0721 06/25/17 1229  GLUCAP 97 79 84   D-Dimer No results for input(s): DDIMER in the last 72 hours. Hgb A1c No results for input(s): HGBA1C in the last 72 hours. Lipid Profile No results for input(s): CHOL, HDL, LDLCALC, TRIG, CHOLHDL, LDLDIRECT in the last 72 hours. Thyroid function studies No results for input(s): TSH, T4TOTAL, T3FREE, THYROIDAB in the last 72 hours.  Invalid input(s): FREET3 Anemia work up No results for input(s): VITAMINB12, FOLATE, FERRITIN, TIBC, IRON, RETICCTPCT in the last 72 hours. Urinalysis No results found for: COLORURINE, APPEARANCEUR, LABSPEC, PHURINE, GLUCOSEU, HGBUR, BILIRUBINUR, KETONESUR, PROTEINUR, UROBILINOGEN, NITRITE, LEUKOCYTESUR Sepsis Labs Invalid input(s): PROCALCITONIN,  WBC,  LACTICIDVEN Microbiology Recent Results (from the past 240 hour(s))  Surgical pcr screen     Status: None   Collection Time: 06/22/17 12:51 AM  Result Value Ref Range Status   MRSA, PCR NEGATIVE NEGATIVE Final   Staphylococcus aureus NEGATIVE NEGATIVE Final    Comment: (NOTE) The Xpert SA Assay (FDA approved for NASAL specimens in patients 71 years of age and older), is one component of a comprehensive surveillance program. It is not intended to diagnose infection nor to guide or monitor treatment.      Time coordinating discharge: Over 30 minutes  SIGNED:   Alba Cory, MD  [facility] 06/28/2017, 9:32 AM Pager [phone]  If 7PM-7AM, please contact night-coverage [email or web address] Password [email or web address]

## 2017-06-28 NOTE — Progress Notes (Signed)
Occupational Therapy Treatment Patient Details Name: Tiffany Barnes MRN: [id number] DOB: May 15, 1956 Today's Date: 06/28/2017    History of present illness R distal femur fx, s/p ORIF. PMH of MS   OT comments  Pt demonstrates increase confidence this session with sit<>stand and 3n1 transfer. Pt benefits from counting 1-3 then moving to help her understand the sequence. Pt states "I like that you told me then we did it." pt without any anxiety limitations this session. Pt also responds well to a cool wash cloth during rest breaks.   Follow Up Recommendations  SNF    Equipment Recommendations  Other (comment)(defer to [facility])    Recommendations for Other Services      Precautions / Restrictions Precautions Precautions: Fall Precaution Comments: fearful of falling and benefits from premedication for anxiety Restrictions RLE Weight Bearing: Weight bearing as tolerated       Mobility Bed Mobility Overal bed mobility: Needs Assistance Bed Mobility: Supine to Sit     Supine to sit: Min guard Sit to supine: Mod assist   General bed mobility comments: pt is able to use rails to progress toward EOB but requires bil LE back onto bed surface. Pt mod v/c for return to supone  Transfers Overall transfer level: Needs assistance   Transfers: Sit to/from Stand Sit to Stand: +2 physical assistance;Mod assist         General transfer comment: pt was unable to complete stand pivot to 3n1 this session    Balance           Standing balance support: Bilateral upper extremity supported Standing balance-Leahy Scale: Poor Standing balance comment: pt requires steady with heavy lean on stedy frame. pt fatigues after 45 seconds -1 minute and requesting to return to sitting                           ADL either performed or assessed with clinical judgement   ADL Overall ADL's : Needs assistance/impaired                         Toilet Transfer: +2 for physical  assistance;Maximal assistance;BSC Toilet Transfer Details (indicate cue type and reason): pt completed x3 sit<>Stand from bed surface to build patients confidence after last session being stuck on 3n1 and requiring hoyer lift with PT. Pt educated on the bed height being lower than 3n1 and willing to attempt. OT moving bed away and placing 3n1 directly behind patient in standing. pt completed transfer and reports "that wasnt bad"           General ADL Comments: pt with weight shifting in static standing to help with off setting the weight on feet to work toward stand pivot to 3n1. pt is able to demonstrate R LE but L LE remains very firm to the floor.      Vision       Perception     Praxis      Cognition Arousal/Alertness: Awake/alert Behavior During Therapy: WFL for tasks assessed/performed;Anxious Overall Cognitive Status: Within Functional Limits for tasks assessed                                          Exercises     Shoulder Instructions       General Comments      Pertinent Vitals/ Pain  Pain Assessment: Faces Faces Pain Scale: Hurts little more Pain Location: RLE Pain Descriptors / Indicators: Sore;Grimacing;Guarding Pain Intervention(s): Monitored during session;Premedicated before session;Repositioned  Home Living                                          Prior Functioning/Environment              Frequency  Min 2X/week        Progress Toward Goals  OT Goals(current goals can now be found in the care plan section)  Progress towards OT goals: Progressing toward goals  Acute Rehab OT Goals Patient Stated Goal: get better and return home OT Goal Formulation: With patient/family Time For Goal Achievement: 07/07/17 Potential to Achieve Goals: Good ADL Goals Pt Will Perform Lower Body Bathing: with min assist;sit to/from stand Pt Will Perform Lower Body Dressing: with min assist;sit to/from stand Pt Will  Transfer to Toilet: with mod assist;ambulating;bedside commode Pt Will Perform Toileting - Clothing Manipulation and hygiene: with min assist;sit to/from stand  Plan Discharge plan needs to be updated    Co-evaluation                 AM-PAC PT "6 Clicks" Daily Activity     Outcome Measure   Help from another person eating meals?: A Little Help from another person taking care of personal grooming?: A Little Help from another person toileting, which includes using toliet, bedpan, or urinal?: A Lot Help from another person bathing (including washing, rinsing, drying)?: A Lot Help from another person to put on and taking off regular upper body clothing?: A Little Help from another person to put on and taking off regular lower body clothing?: A Lot 6 Click Score: 15    End of Session    OT Visit Diagnosis: Unsteadiness on feet (R26.81);Other abnormalities of gait and mobility (R26.89);History of falling (Z91.81);Muscle weakness (generalized) (M62.81);Pain Pain - Right/Left: Right Pain - part of body: Leg   Activity Tolerance Patient tolerated treatment well   Patient Left in bed;with call bell/phone within reach;with family/visitor present   Nurse Communication Mobility status;Precautions        Time: 1011(1011)-1042 OT Time Calculation (min): 31 min  Charges: OT General Charges $OT Visit: 1 Visit OT Treatments $Self Care/Home Management : 23-37 mins   Mateo Flow   OTR/L Pager: [phone] Office: [phone] .    Boone Master B 06/28/2017, 11:59 AM

## 2017-06-28 NOTE — Progress Notes (Signed)
I discussed with Dr. Sunnie Nielsen and RN CM. I feel peer to peer with [facility] is not warranted in this case for CIR admit. Noted Cariology consult. Recommend SNF as planned. We will sign off at this time. [phone]

## 2017-06-28 NOTE — Clinical Social Work Placement (Signed)
   CLINICAL SOCIAL WORK PLACEMENT  NOTE  Date:  06/28/2017  Patient Details  Name: Tiffany Barnes MRN: [id number] Date of Birth: 10-26-1955  Clinical Social Work is seeking post-discharge placement for this patient at the Skilled  Nursing Facility level of care (*CSW will initial, date and re-position this form in  chart as items are completed):  Yes   Patient/family provided with [facility] Department's list of facilities offering this level of care within the geographic area requested by the patient (or if unable, by the patient's family).  Yes   Patient/family informed of their freedom to choose among providers that offer the needed level of care, that participate in Medicare, Medicaid or managed care program needed by the patient, have an available bed and are willing to accept the patient.  Yes   Patient/family informed of [facility]'s ownership interest in [facility] and [facility], as well as of the fact that they are under no obligation to receive care at these facilities.  PASRR submitted to EDS on       PASRR number received on       Existing PASRR number confirmed on 06/24/17     FL2 transmitted to all facilities in geographic area requested by pt/family on       FL2 transmitted to all facilities within larger geographic area on 06/24/17     Patient informed that his/her managed care company has contracts with or will negotiate with certain facilities, including the following:        Yes   Patient/family informed of bed offers received.  Patient chooses bed at [facility]     Physician recommends and patient chooses bed at      Patient to be transferred to [facility] on 06/28/17.  Patient to be transferred to facility by [facility]     Patient family notified on 06/28/17 of transfer.  Name of family member notified:  spouse at bedside     PHYSICIAN       Additional Comment:     _______________________________________________ Tresa Moore, LCSW 06/28/2017, 9:41 AM

## 2017-07-01 ENCOUNTER — Telehealth: Payer: Self-pay | Admitting: *Deleted

## 2017-07-01 NOTE — Telephone Encounter (Signed)
Received call from [employer] asking for patient's first dose observation start date of Gilenya. This RN advised her that the EMR only goes back into 2014, and as of Aug 2014 the patient was already taking Gilenya. Therefore she would have had to have had first dose obs prior to that date to be on Gilenya at that time. Nicolette verbalized understanding, appreciation.

## 2017-07-21 ENCOUNTER — Ambulatory Visit: Payer: Medicare HMO | Admitting: Adult Health
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
## 2017-10-01 ENCOUNTER — Encounter: Payer: Self-pay | Admitting: Nurse Practitioner

## 2017-10-01 ENCOUNTER — Ambulatory Visit: Payer: Medicare HMO | Admitting: Nurse Practitioner

## 2017-10-01 VITALS — BP 151/83 | HR 53 | Ht 65.5 in | Wt 173.0 lb

## 2017-10-01 DIAGNOSIS — G35 Multiple sclerosis: Secondary | ICD-10-CM

## 2017-10-01 DIAGNOSIS — Z5181 Encounter for therapeutic drug level monitoring: Secondary | ICD-10-CM | POA: Insufficient documentation

## 2017-10-01 DIAGNOSIS — R269 Unspecified abnormalities of gait and mobility: Secondary | ICD-10-CM | POA: Diagnosis not present

## 2017-10-01 NOTE — Patient Instructions (Signed)
Continue Gilenya current dose and  vitamin D We will get labs today to monitor adverse effects of Gilenya Follow-up 59-month

## 2017-10-02 LAB — CBC WITH DIFFERENTIAL/PLATELET
Basophils Absolute: 0 10*3/uL (ref 0.0–0.2)
Basos: 0 %
EOS (ABSOLUTE): 0.1 10*3/uL (ref 0.0–0.4)
EOS: 2 %
HEMATOCRIT: 40.3 % (ref 34.0–46.6)
HEMOGLOBIN: 12.9 g/dL (ref 11.1–15.9)
IMMATURE GRANS (ABS): 0 10*3/uL (ref 0.0–0.1)
Immature Granulocytes: 0 %
LYMPHS ABS: 0.4 10*3/uL — AB (ref 0.7–3.1)
Lymphs: 8 %
MCH: 28.6 pg (ref 26.6–33.0)
MCHC: 32 g/dL (ref 31.5–35.7)
MCV: 89 fL (ref 79–97)
MONOCYTES: 7 %
Monocytes Absolute: 0.3 10*3/uL (ref 0.1–0.9)
NEUTROS ABS: 4.2 10*3/uL (ref 1.4–7.0)
Neutrophils: 83 %
PLATELETS: 267 10*3/uL (ref 150–379)
RBC: 4.51 x10E6/uL (ref 3.77–5.28)
RDW: 14 % (ref 12.3–15.4)
WBC: 5.1 10*3/uL (ref 3.4–10.8)

## 2017-10-04 ENCOUNTER — Telehealth: Payer: Self-pay | Admitting: *Deleted

## 2017-10-04 NOTE — Telephone Encounter (Signed)
Spoke with patient and informed her that her Labs are stable. She verbalized understanding, appreciation.

## 2018-01-21 ENCOUNTER — Ambulatory Visit
Admission: RE | Admit: 2018-01-21 | Discharge: 2018-01-21 | Disposition: A | Discharge status: Home / Self Care | Payer: Medicare HMO | Source: Ambulatory Visit | Attending: Physician Assistant | Admitting: Physician Assistant

## 2018-01-21 ENCOUNTER — Other Ambulatory Visit: Payer: Self-pay | Admitting: Physician Assistant

## 2018-01-21 DIAGNOSIS — R197 Diarrhea, unspecified: Secondary | ICD-10-CM

## 2018-04-27 ENCOUNTER — Telehealth: Payer: Self-pay | Admitting: *Deleted

## 2018-04-27 MED ORDER — FINGOLIMOD HCL 0.5 MG PO CAPS: 0.5000 mg | ORAL_CAPSULE | Freq: Every day | ORAL | 4 refills | Status: DC

## 2018-04-27 NOTE — Telephone Encounter (Signed)
Spoke to pt and she will come or have Tiffany Barnes to come and pick up. Systems analyst).  Placed up front the PAP information.

## 2018-04-27 NOTE — Telephone Encounter (Signed)
Received form and completed.  To be reviewed and signed by Dr. Marjory Lies

## 2018-06-10 ENCOUNTER — Ambulatory Visit: Payer: Medicare HMO | Admitting: Nurse Practitioner

## 2018-06-13 ENCOUNTER — Telehealth: Payer: Self-pay | Admitting: Diagnostic Neuroimaging

## 2018-06-13 NOTE — Telephone Encounter (Signed)
Lafonda Mosses from patient assistance called and stated that the patient needs a PA for Gilenya. The number to call is [phone]. Please call and obtain.

## 2018-06-13 NOTE — Telephone Encounter (Signed)
I called Pt assistance and got insurance information [employer] MCR  [id number]  BIN [id number], GRP [id number], PCN [id number].

## 2018-06-14 NOTE — Telephone Encounter (Signed)
Initiated CMM KEY [id number] for gilenya. PA.

## 2018-06-16 ENCOUNTER — Encounter: Payer: Self-pay | Admitting: *Deleted

## 2018-06-16 NOTE — Telephone Encounter (Signed)
I called [employer] [phone] as received from [facility] that authorization on file.  Spoke to [place].  She stated that from 2017 has approval for gilenya 30 tabs / 30 days.  (last authorization was 09/2017 thru 09/2018.  Plan does not pay for 90tabs/90 days.  CASE # [id number].  She is to fax to [phone] pre approval letter.  I called [employer] P[employer], spoke to [place].  From looking at the records they do not have this information.  I have not receeived the faxed letter as yet but will fax to them at [phone] when it arrives.

## 2018-06-17 NOTE — Telephone Encounter (Signed)
Received fax from [facility],  Fax confirmation received [phone].

## 2018-06-23 NOTE — Telephone Encounter (Signed)
Error

## 2018-07-26 NOTE — Telephone Encounter (Signed)
Received fax from [employer] P[employer] re: patient has been approved for Gilenya assistance for the remainder of calendar year. For assistance call P [phone], fax [phone].
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
## 2018-08-03 ENCOUNTER — Ambulatory Visit: Payer: Medicare HMO | Admitting: Nurse Practitioner

## 2018-08-03 ENCOUNTER — Other Ambulatory Visit: Payer: Self-pay

## 2018-08-03 ENCOUNTER — Other Ambulatory Visit: Payer: Self-pay | Admitting: *Deleted

## 2018-08-03 ENCOUNTER — Encounter: Payer: Self-pay | Admitting: Nurse Practitioner

## 2018-08-03 VITALS — BP 172/80 | HR 57 | Ht 65.5 in | Wt 160.4 lb

## 2018-08-03 DIAGNOSIS — R269 Unspecified abnormalities of gait and mobility: Secondary | ICD-10-CM | POA: Diagnosis not present

## 2018-08-03 DIAGNOSIS — G35 Multiple sclerosis: Secondary | ICD-10-CM | POA: Diagnosis not present

## 2018-08-03 MED ORDER — FINGOLIMOD HCL 0.5 MG PO CAPS: 0.5000 mg | ORAL_CAPSULE | Freq: Every day | ORAL | 3 refills | Status: DC

## 2018-08-03 NOTE — Telephone Encounter (Signed)
I called and spoke to cynthia with [facility].  Relayed that they have PA for gilenya and prescription for gilenya.  Pt needing refill.  She confirmed did have PA, the prescription they had only had for 90 day no refills.  I stated that did have 4 refills from 04-27-2018.  Needed new one.  Placed one and she said [facility].  Placed order.

## 2018-08-03 NOTE — Patient Instructions (Addendum)
Continue Gilenya current dose and  vitamin D We will get labs today to monitor adverse effects of Gilenya Follow-up 75-month

## 2018-08-03 NOTE — Progress Notes (Signed)
I reviewed note and agree with plan.   Suanne Marker, MD 08/03/2018, 5:29 PM Certified in Neurology, Neurophysiology and Neuroimaging  [facility] [address] [place], [place] [address] [phone]

## 2018-08-04 ENCOUNTER — Telehealth: Payer: Self-pay

## 2018-08-04 LAB — CBC WITH DIFFERENTIAL/PLATELET
Basophils Absolute: 0 10*3/uL (ref 0.0–0.2)
Basos: 1 %
EOS (ABSOLUTE): 0.1 10*3/uL (ref 0.0–0.4)
Eos: 2 %
Hematocrit: 39.2 % (ref 34.0–46.6)
Hemoglobin: 12.6 g/dL (ref 11.1–15.9)
Immature Grans (Abs): 0 10*3/uL (ref 0.0–0.1)
Immature Granulocytes: 0 %
Lymphocytes Absolute: 0.2 10*3/uL — ABNORMAL LOW (ref 0.7–3.1)
Lymphs: 4 %
MCH: 29 pg (ref 26.6–33.0)
MCHC: 32.1 g/dL (ref 31.5–35.7)
MCV: 90 fL (ref 79–97)
Monocytes Absolute: 0.5 10*3/uL (ref 0.1–0.9)
Monocytes: 12 %
Neutrophils Absolute: 3.5 10*3/uL (ref 1.4–7.0)
Neutrophils: 81 %
PLATELETS: 248 10*3/uL (ref 150–450)
RBC: 4.34 x10E6/uL (ref 3.77–5.28)
RDW: 13.1 % (ref 11.7–15.4)
WBC: 4.2 10*3/uL (ref 3.4–10.8)

## 2018-08-04 LAB — COMPREHENSIVE METABOLIC PANEL
ALT: 9 IU/L (ref 0–32)
AST: 18 IU/L (ref 0–40)
Albumin/Globulin Ratio: 2 (ref 1.2–2.2)
Albumin: 4 g/dL (ref 3.8–4.8)
Alkaline Phosphatase: 128 IU/L — ABNORMAL HIGH (ref 39–117)
BUN/Creatinine Ratio: 40 — ABNORMAL HIGH (ref 12–28)
BUN: 19 mg/dL (ref 8–27)
Bilirubin Total: 0.5 mg/dL (ref 0.0–1.2)
CO2: 26 mmol/L (ref 20–29)
Calcium: 9.1 mg/dL (ref 8.7–10.3)
Chloride: 103 mmol/L (ref 96–106)
Creatinine, Ser: 0.48 mg/dL — ABNORMAL LOW (ref 0.57–1.00)
GFR calc Af Amer: 121 mL/min/{1.73_m2} (ref >59)
GFR calc non Af Amer: 105 mL/min/{1.73_m2} (ref >59)
Globulin, Total: 2 g/dL (ref 1.5–4.5)
Glucose: 78 mg/dL (ref 65–99)
Potassium: 4.6 mmol/L (ref 3.5–5.2)
Sodium: 143 mmol/L (ref 134–144)
Total Protein: 6 g/dL (ref 6.0–8.5)

## 2018-08-04 NOTE — Telephone Encounter (Signed)
Spoke with the patient and she verbalized understanding her results. No questions or concerns at this time.   

## 2018-08-04 NOTE — Telephone Encounter (Signed)
-----   Message from Nilda Riggs, NP sent at 08/04/2018 11:38 AM EDT ----- Labs stable please call the patient

## 2018-08-15 ENCOUNTER — Telehealth: Payer: Self-pay | Admitting: Nurse Practitioner

## 2018-08-15 NOTE — Telephone Encounter (Signed)
Pt has called to inform that she is on Losartin 50mg  for her blood pressure.  Please call

## 2018-08-15 NOTE — Telephone Encounter (Signed)
Updated on pts med list.

## 2018-08-15 NOTE — Addendum Note (Signed)
Addended by: Guy Begin on: 08/15/2018 02:05 PM   Modules accepted: Orders

## 2019-02-07 ENCOUNTER — Encounter: Payer: Self-pay | Admitting: Neurology

## 2019-02-07 ENCOUNTER — Other Ambulatory Visit: Payer: Self-pay

## 2019-02-07 ENCOUNTER — Ambulatory Visit (INDEPENDENT_AMBULATORY_CARE_PROVIDER_SITE_OTHER): Payer: Medicare HMO | Admitting: Neurology

## 2019-02-07 VITALS — BP 132/82 | HR 58 | Temp 98.0°F

## 2019-02-07 DIAGNOSIS — R269 Unspecified abnormalities of gait and mobility: Secondary | ICD-10-CM

## 2019-02-07 DIAGNOSIS — Z5181 Encounter for therapeutic drug level monitoring: Secondary | ICD-10-CM

## 2019-02-07 DIAGNOSIS — G35 Multiple sclerosis: Secondary | ICD-10-CM
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
## 2019-02-08 ENCOUNTER — Telehealth: Payer: Self-pay

## 2019-02-08 LAB — CBC WITH DIFFERENTIAL/PLATELET
Basophils Absolute: 0 10*3/uL (ref 0.0–0.2)
Basos: 0 %
EOS (ABSOLUTE): 0.2 10*3/uL (ref 0.0–0.4)
Eos: 4 %
Hematocrit: 38.3 % (ref 34.0–46.6)
Hemoglobin: 12.6 g/dL (ref 11.1–15.9)
Immature Grans (Abs): 0 10*3/uL (ref 0.0–0.1)
Immature Granulocytes: 1 %
Lymphocytes Absolute: 0.2 10*3/uL — ABNORMAL LOW (ref 0.7–3.1)
Lymphs: 6 %
MCH: 29.4 pg (ref 26.6–33.0)
MCHC: 32.9 g/dL (ref 31.5–35.7)
MCV: 90 fL (ref 79–97)
Monocytes Absolute: 0.5 10*3/uL (ref 0.1–0.9)
Monocytes: 14 %
Neutrophils Absolute: 2.7 10*3/uL (ref 1.4–7.0)
Neutrophils: 75 %
Platelets: 238 10*3/uL (ref 150–450)
RBC: 4.28 x10E6/uL (ref 3.77–5.28)
RDW: 13.2 % (ref 11.7–15.4)
WBC: 3.6 10*3/uL (ref 3.4–10.8)

## 2019-02-08 LAB — COMPREHENSIVE METABOLIC PANEL
ALT: 12 IU/L (ref 0–32)
AST: 16 IU/L (ref 0–40)
Albumin/Globulin Ratio: 2 (ref 1.2–2.2)
Albumin: 4 g/dL (ref 3.8–4.8)
Alkaline Phosphatase: 121 IU/L — ABNORMAL HIGH (ref 39–117)
BUN/Creatinine Ratio: 37 — ABNORMAL HIGH (ref 12–28)
BUN: 19 mg/dL (ref 8–27)
Bilirubin Total: 0.4 mg/dL (ref 0.0–1.2)
CO2: 27 mmol/L (ref 20–29)
Calcium: 9.3 mg/dL (ref 8.7–10.3)
Chloride: 106 mmol/L (ref 96–106)
Creatinine, Ser: 0.52 mg/dL — ABNORMAL LOW (ref 0.57–1.00)
GFR calc Af Amer: 118 mL/min/{1.73_m2} (ref >59)
GFR calc non Af Amer: 102 mL/min/{1.73_m2} (ref >59)
Globulin, Total: 2 g/dL (ref 1.5–4.5)
Glucose: 87 mg/dL (ref 65–99)
Potassium: 4.7 mmol/L (ref 3.5–5.2)
Sodium: 144 mmol/L (ref 134–144)
Total Protein: 6 g/dL (ref 6.0–8.5)

## 2019-02-08 NOTE — Telephone Encounter (Signed)
Pt notified of lab results and verbalized understanding.

## 2019-02-08 NOTE — Telephone Encounter (Signed)
-----   Message from Kathrynn Ducking, MD sent at 02/08/2019  7:14 AM EDT ----- Blood work reveals a mild elevation of alkaline phosphatase, not likely to be clinically significant.  CBC is unremarkable, absolute lymphocyte count remains stable at 0.2 on Gilenya.  We will continue to follow.  Please call the patient. ----- Message ----- From: Lavone Neri Lab Results In Sent: 02/08/2019   5:38 AM EDT To: Kathrynn Ducking, MD

## 2019-02-25 IMAGING — CR DG ABDOMEN 2V
2 series · 2 of 2 positions shown · non-contrast
Comparison: No recent prior.

CLINICAL DATA: Watery diarrhea.  Nausea.  Cramping.

EXAM:
ABDOMEN - 2 VIEW

[w abdomen upright]
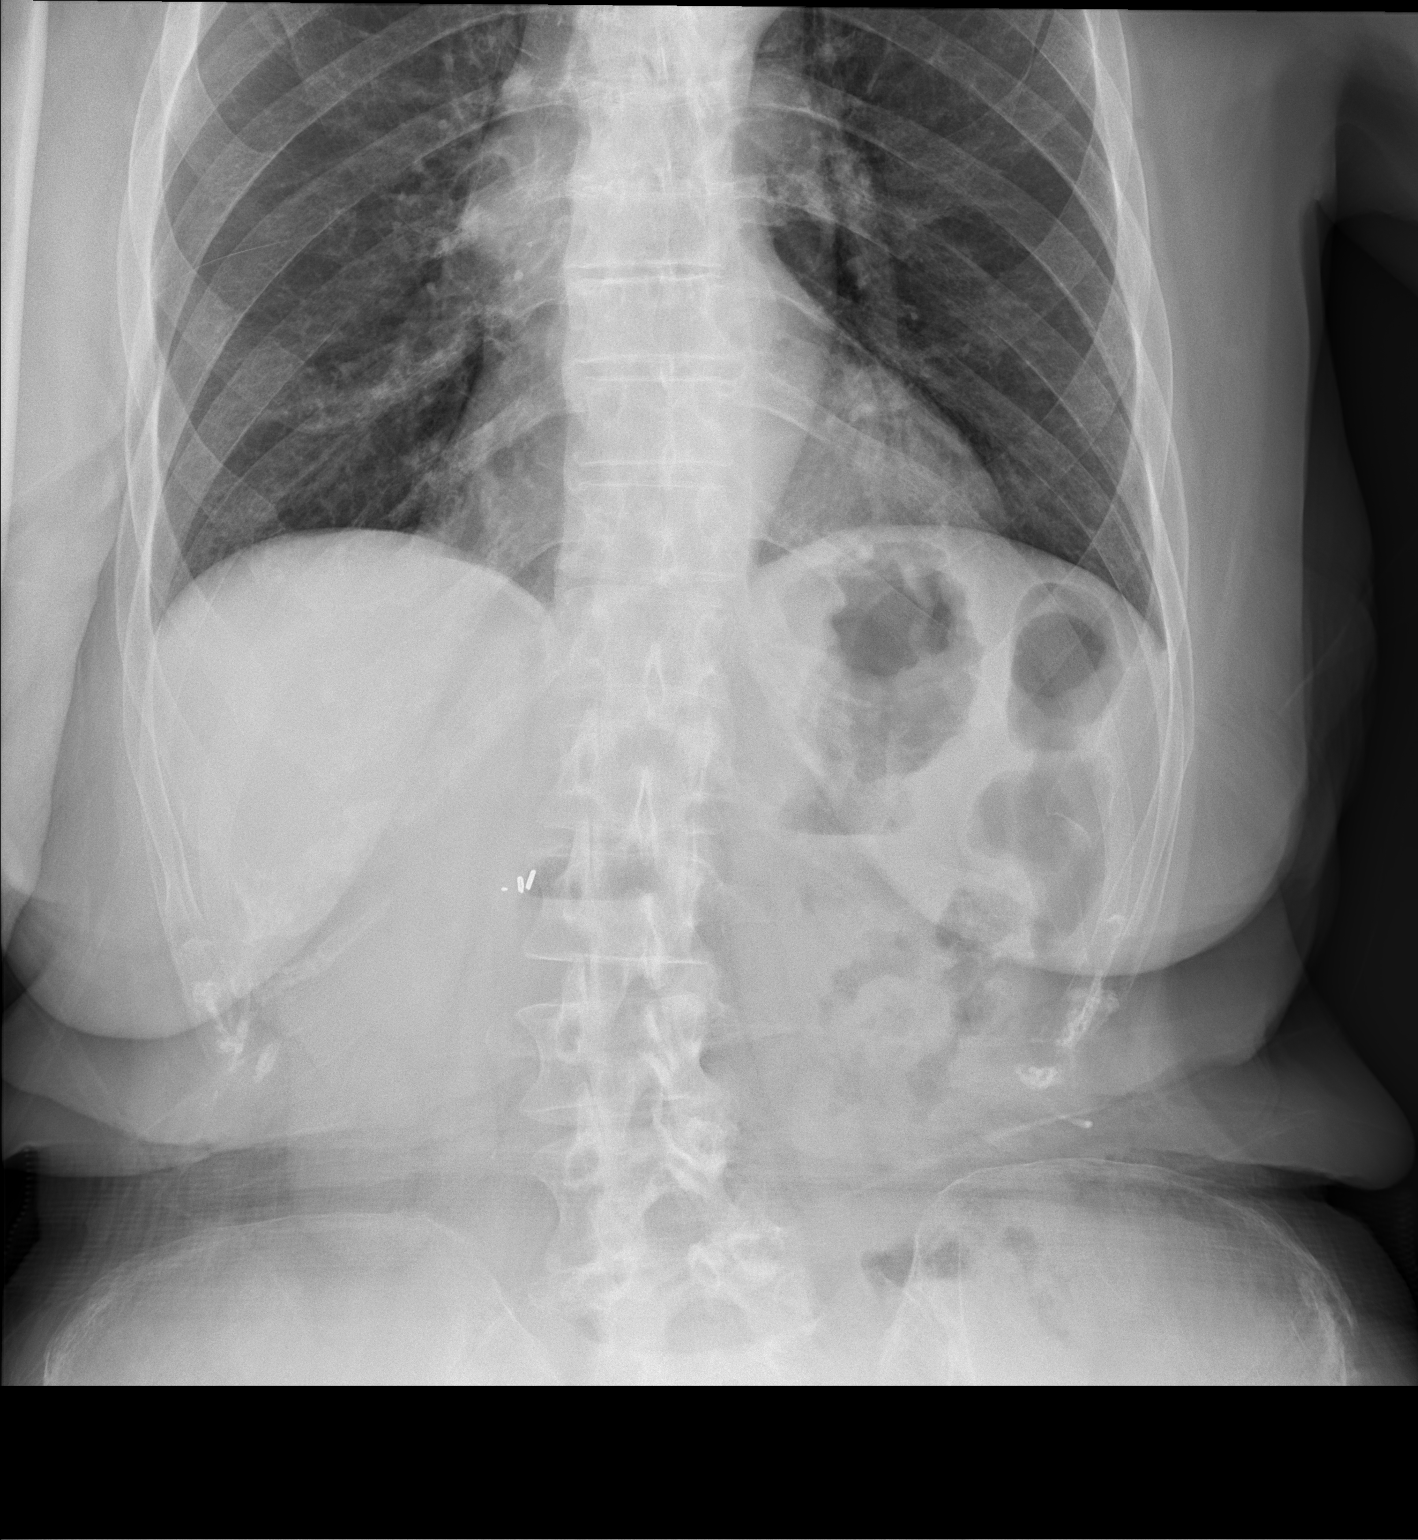

[t abdomen supine]
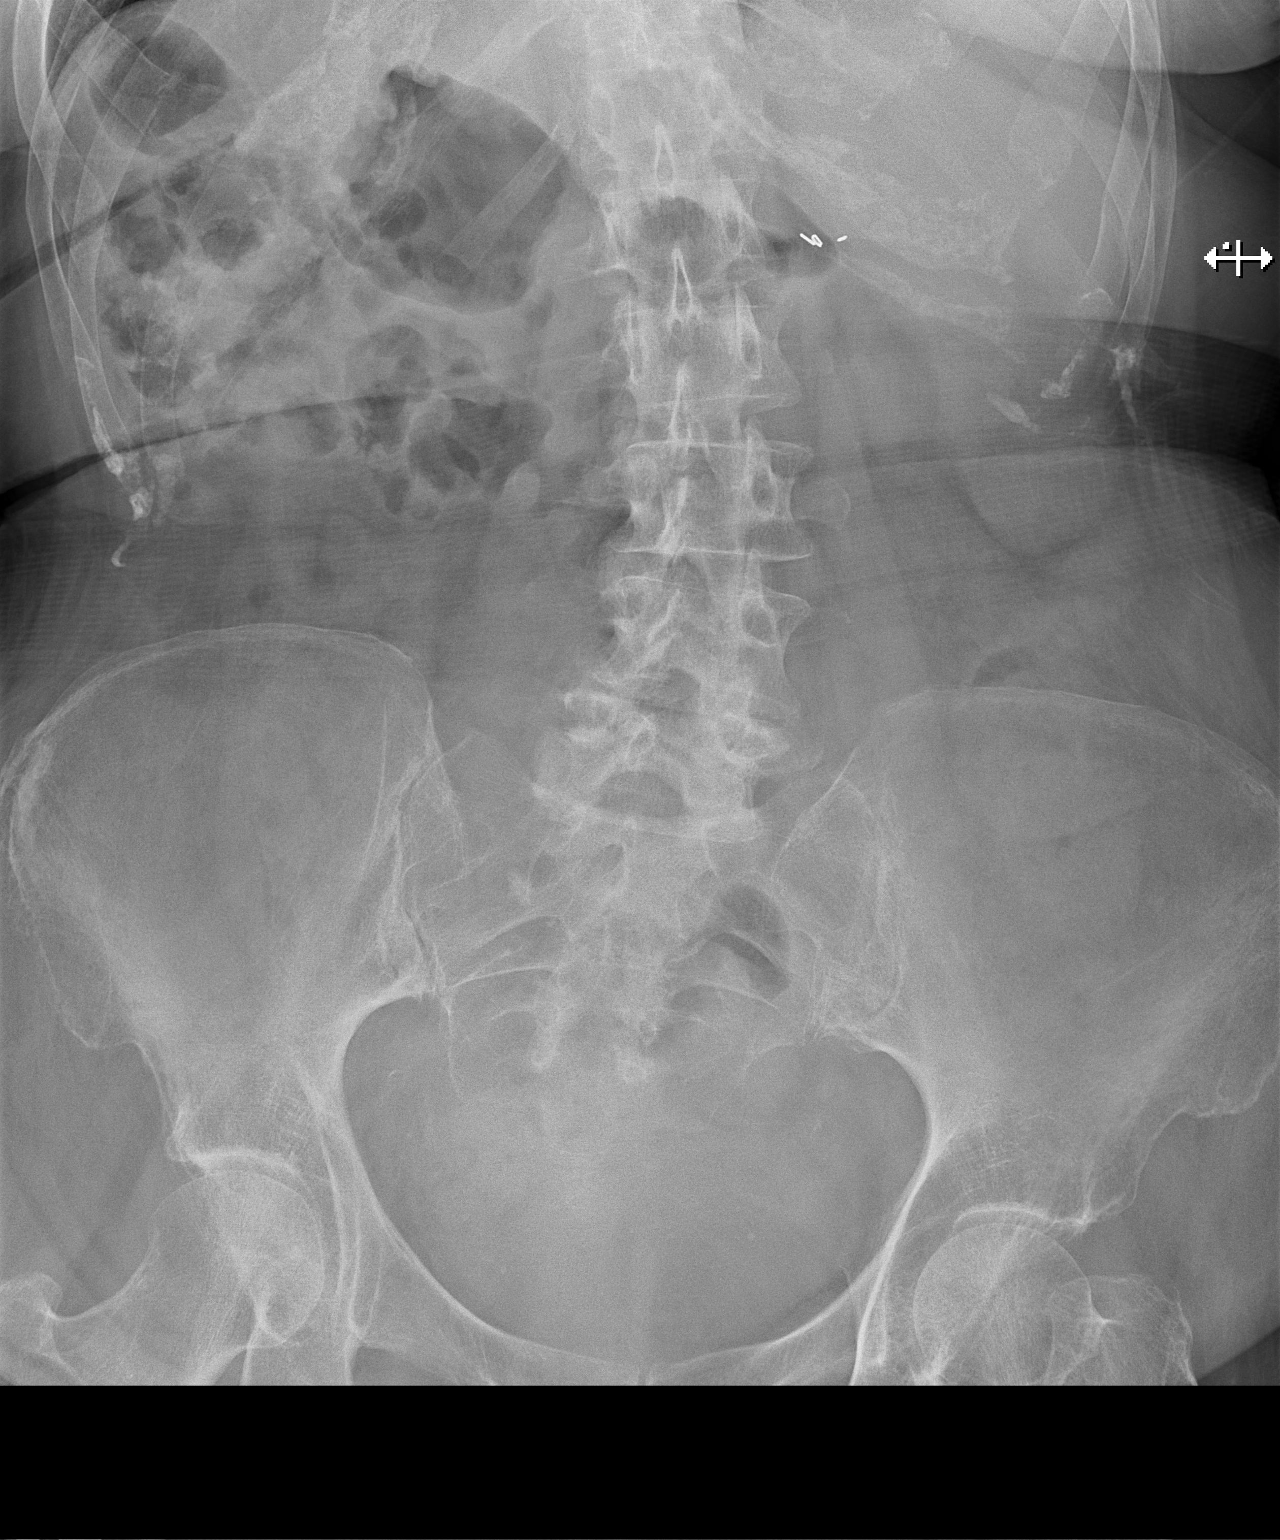

[2 of 2 positions shown; findings below may reference images not displayed]

FINDINGS: Surgical clips right upper quadrant. Soft tissue structures are
unremarkable. No bowel distention. No free air. Calcifications in
the pelvis consistent phleboliths. Tiny sclerotic density noted
right sacrum most likely tiny bone island. Lumbar spine scoliosis
concave right. Degenerative changes both hips.
IMPRESSION: No acute abnormality identified.

## 2019-07-26 ENCOUNTER — Ambulatory Visit: Payer: Medicare HMO | Admitting: Family Medicine

## 2019-07-26 ENCOUNTER — Other Ambulatory Visit: Payer: Self-pay

## 2019-07-26 ENCOUNTER — Encounter: Payer: Self-pay | Admitting: Family Medicine

## 2019-07-26 VITALS — BP 128/74 | HR 56 | Temp 97.2°F | Ht 65.5 in | Wt 190.0 lb

## 2019-07-26 DIAGNOSIS — G35 Multiple sclerosis: Secondary | ICD-10-CM | POA: Diagnosis not present

## 2019-07-26 DIAGNOSIS — R269 Unspecified abnormalities of gait and mobility: Secondary | ICD-10-CM

## 2019-07-26 DIAGNOSIS — R3915 Urgency of urination: Secondary | ICD-10-CM | POA: Diagnosis not present

## 2019-07-26 MED ORDER — OXYBUTYNIN CHLORIDE ER 5 MG PO TB24: 5.0000 mg | ORAL_TABLET | Freq: Every day | ORAL | 11 refills | Status: DC

## 2019-07-26 NOTE — Patient Instructions (Addendum)
Continue Gilenya  We will try start oxybutynin for urinary urgency.   Continue to stay hydrated, eat a healthy diet and be as active as possible  Follow up in 6 months   Oxybutynin extended-release tablets What is this medicine? OXYBUTYNIN (ox i BYOO ti nin) is used to treat overactive bladder. This medicine reduces the amount of bathroom visits. It may also help to control wetting accidents. This medicine may be used for other purposes; ask your health care provider or pharmacist if you have questions. COMMON BRAND NAME(S): Ditropan XL What should I tell my health care provider before I take this medicine? They need to know if you have any of these conditions:  autonomic neuropathy  dementia  difficulty passing urine  glaucoma  intestinal obstruction  kidney disease  liver disease  myasthenia gravis  Parkinson's disease  an unusual or allergic reaction to oxybutynin, other medicines, foods, dyes, or preservatives  pregnant or trying to get pregnant  breast-feeding How should I use this medicine? Take this medicine by mouth with a glass of water. Swallow whole, do not crush, cut, or chew. Follow the directions on the prescription label. You can take this medicine with or without food. Take your doses at regular intervals. Do not take your medicine more often than directed. Talk to your pediatrician regarding the use of this medicine in children. Special care may be needed. While this drug may be prescribed for children as young as 6 years for selected conditions, precautions do apply. Overdosage: If you think you have taken too much of this medicine contact a poison control center or emergency room at once. NOTE: This medicine is only for you. Do not share this medicine with others. What if I miss a dose? If you miss a dose, take it as soon as you can. If it is almost time for your next dose, take only that dose. Do not take double or extra doses. What may interact  with this medicine?  antihistamines for allergy, cough and cold  atropine  certain medicines for bladder problems like oxybutynin, tolterodine  certain medicines for Parkinson's disease like benztropine, trihexyphenidyl  certain medicines for stomach problems like dicyclomine, hyoscyamine  certain medicines for travel sickness like scopolamine  clarithromycin  erythromycin  ipratropium  medicines for fungal infections, like fluconazole, itraconazole, ketoconazole or voriconazole This list may not describe all possible interactions. Give your health care provider a list of all the medicines, herbs, non-prescription drugs, or dietary supplements you use. Also tell them if you smoke, drink alcohol, or use illegal drugs. Some items may interact with your medicine. What should I watch for while using this medicine? It may take a few weeks to notice the full benefit from this medicine. You may need to limit your intake tea, coffee, caffeinated sodas, and alcohol. These drinks may make your symptoms worse. You may get drowsy or dizzy. Do not drive, use machinery, or do anything that needs mental alertness until you know how this medicine affects you. Do not stand or sit up quickly, especially if you are an older patient. This reduces the risk of dizzy or fainting spells. Alcohol may interfere with the effect of this medicine. Avoid alcoholic drinks. Your mouth may get dry. Chewing sugarless gum or sucking hard candy, and drinking plenty of water may help. Contact your doctor if the problem does not go away or is severe. This medicine may cause dry eyes and blurred vision. If you wear contact lenses, you may feel  some discomfort. Lubricating drops may help. See your eyecare professional if the problem does not go away or is severe. You may notice the shells of the tablets in your stool from time to time. This is normal. Avoid extreme heat. This medicine can cause you to sweat less than normal.  Your body temperature could increase to dangerous levels, which may lead to heat stroke. What side effects may I notice from receiving this medicine? Side effects that you should report to your doctor or health care professional as soon as possible:  allergic reactions like skin rash, itching or hives, swelling of the face, lips, or tongue  agitation  breathing problems  confusion  fever  flushing (reddening of the skin)  hallucinations  memory loss  pain or difficulty passing urine  palpitations  unusually weak or tired Side effects that usually do not require medical attention (report to your doctor or health care professional if they continue or are bothersome):  constipation  headache  sexual difficulties (impotence) This list may not describe all possible side effects. Call your doctor for medical advice about side effects. You may report side effects to FDA at 1-800-FDA-1088. Where should I keep my medicine? Keep out of the reach of children. Store at room temperature between 15 and 30 degrees C (59 and 86 degrees F). Protect from moisture and humidity. Throw away any unused medicine after the expiration date. NOTE: This sheet is a summary. It may not cover all possible information. If you have questions about this medicine, talk to your doctor, pharmacist, or health care provider.  2020 Elsevier/Gold Standard (2013-07-27 10:57:06)   Urinary Incontinence  Urinary incontinence refers to a condition in which a person is unable to control where and when to pass urine. A person with this condition will urinate when he or she does not mean to (involuntarily). What are the causes? This condition may be caused by:  Medicines.  Infections.  Constipation.  Overactive bladder muscles.  Weak bladder muscles.  Weak pelvic floor muscles. These muscles provide support for the bladder, intestine, and, in women, the uterus.  Enlarged prostate in men. The prostate is a  gland near the bladder. When it gets too big, it can pinch the urethra. With the urethra blocked, the bladder can weaken and lose the ability to empty properly.  Surgery.  Emotional factors, such as anxiety, stress, or post-traumatic stress disorder (PTSD).  Pelvic organ prolapse. This happens in women when organs shift out of place and into the vagina. This shift can prevent the bladder and urethra from working properly. What increases the risk? The following factors may make you more likely to develop this condition:  Older age.  Obesity and physical inactivity.  Pregnancy and childbirth.  Menopause.  Diseases that affect the nerves or spinal cord (neurological diseases).  Long-term (chronic) coughing. This can increase pressure on the bladder and pelvic floor muscles. What are the signs or symptoms? Symptoms may vary depending on the type of urinary incontinence you have. They include:  A sudden urge to urinate, but passing urine involuntarily before you can get to a bathroom (urge incontinence).  Suddenly passing urine with any activity that forces urine to pass, such as coughing, laughing, exercise, or sneezing (stress incontinence).  Needing to urinate often, but urinating only a small amount, or constantly dribbling urine (overflow incontinence).  Urinating because you cannot get to the bathroom in time due to a physical disability, such as arthritis or injury, or communication and thinking  problems, such as Alzheimer disease (functional incontinence). How is this diagnosed? This condition may be diagnosed based on:  Your medical history.  A physical exam.  Tests, such as: ? Urine tests. ? X-rays of your kidney and bladder. ? Ultrasound. ? CT scan. ? Cystoscopy. In this procedure, a health care provider inserts a tube with a light and camera (cystoscope) through the urethra and into the bladder in order to check for problems. ? Urodynamic testing. These tests assess  how well the bladder, urethra, and sphincter can store and release urine. There are different types of urodynamic tests, and they vary depending on what the test is measuring. To help diagnose your condition, your health care provider may recommend that you keep a log of when you urinate and how much you urinate. How is this treated? Treatment for this condition depends on the type of incontinence that you have and its cause. Treatment may include:  Lifestyle changes, such as: ? Quitting smoking. ? Maintaining a healthy weight. ? Staying active. Try to get 150 minutes of moderate-intensity exercise every week. Ask your health care provider which activities are safe for you. ? Eating a healthy diet.  Avoid high-fat foods, like fried foods.  Avoid refined carbohydrates like white bread and white rice.  Limit how much alcohol and caffeine you drink.  Increase your fiber intake. Foods such as fresh fruits, vegetables, beans, and whole grains are healthy sources of fiber.  Pelvic floor muscle exercises.  Bladder training, such as lengthening the amount of time between bathroom breaks, or using the bathroom at regular intervals.  Using techniques to suppress bladder urges. This can include distraction techniques or controlled breathing exercises.  Medicines to relax the bladder muscles and prevent bladder spasms.  Medicines to help slow or prevent the growth of a man's prostate.  Botox injections. These can help relax the bladder muscles.  Using pulses of electricity to help change bladder reflexes (electrical nerve stimulation).  For women, using a medical device to prevent urine leaks. This is a small, tampon-like, disposable device that is inserted into the urethra.  Injecting collagen or carbon beads (bulking agents) into the urinary sphincter. These can help thicken tissue and close the bladder opening.  Surgery. Follow these instructions at home: Lifestyle  Limit alcohol and  caffeine. These can fill your bladder quickly and irritate it.  Keep yourself clean to help prevent odors and skin damage. Ask your doctor about special skin creams and cleansers that can protect the skin from urine.  Consider wearing pads or adult diapers. Make sure to change them regularly, and always change them right after experiencing incontinence. General instructions  Take over-the-counter and prescription medicines only as told by your health care provider.  Use the bathroom about every 3-4 hours, even if you do not feel the need to urinate. Try to empty your bladder completely every time. After urinating, wait a minute. Then try to urinate again.  Make sure you are in a relaxed position while urinating.  If your incontinence is caused by nerve problems, keep a log of the medicines you take and the times you go to the bathroom.  Keep all follow-up visits as told by your health care provider. This is important. Contact a health care provider if:  You have pain that gets worse.  Your incontinence gets worse. Get help right away if:  You have a fever or chills.  You are unable to urinate.  You have redness in your groin area  or down your legs. Summary  Urinary incontinence refers to a condition in which a person is unable to control where and when to pass urine.  This condition may be caused by medicines, infection, weak bladder muscles, weak pelvic floor muscles, enlargement of the prostate (in men), or surgery.  The following factors increase your risk for developing this condition: older age, obesity, pregnancy and childbirth, menopause, neurological diseases, and chronic coughing.  There are several types of urinary incontinence. They include urge incontinence, stress incontinence, overflow incontinence, and functional incontinence.  This condition is usually treated first with lifestyle and behavioral changes, such as quitting smoking, eating a healthier diet, and  doing regular pelvic floor exercises. Other treatment options include medicines, bulking agents, medical devices, electrical nerve stimulation, or surgery. This information is not intended to replace advice given to you by your health care provider. Make sure you discuss any questions you have with your health care provider. Document Revised: 05/21/2017 Document Reviewed: 08/20/2016 Elsevier Patient Education  2020 [employer]   Multiple Sclerosis Multiple sclerosis (MS) is a disease of the brain, spinal cord, and optic nerves (central nervous system). It causes the body's disease-fighting (immune) system to destroy the protective covering (myelin sheath) around nerves in the brain. When this happens, signals (nerve impulses) going to and from the brain and spinal cord do not get sent properly or may not get sent at all. There are several types of MS:  Relapsing-remitting MS. This is the most common type. This causes sudden attacks of symptoms. After an attack, you may recover completely until the next attack, or some symptoms may remain permanently.  Secondary progressive MS. This usually develops after the onset of relapsing-remitting MS. Similar to relapsing-remitting MS, this type also causes sudden attacks of symptoms. Attacks may be less frequent, but symptoms slowly get worse (progress) over time.  Primary progressive MS. This causes symptoms that steadily progress over time. This type of MS does not cause sudden attacks of symptoms. The age of onset of MS varies, but it often develops between 55-20 years of age. MS is a lifelong (chronic) condition. There is no cure, but treatment can help slow down the progression of the disease. What are the causes? The cause of this condition is not known. What increases the risk? You are more likely to develop this condition if:  You are a woman.  You have a relative with MS. However, the condition is not passed from parent to child  (inherited).  You have a lack (deficiency) of vitamin D.  You smoke. MS is more common in the [place] than in the [place]. What are the signs or symptoms? Relapsing-remitting and secondary progressive MS cause symptoms to occur in episodes or attacks that may last weeks to months. There may be long periods between attacks in which there are almost no symptoms. Primary progressive MS causes symptoms to steadily progress after they develop. Symptoms of MS vary because of the many different ways it affects the central nervous system. The main symptoms include:  Vision problems and eye pain.  Numbness.  Weakness.  Inability to move your arms, hands, feet, or legs (paralysis).  Balance problems.  Shaking that you cannot control (tremors).  Muscle spasms.  Problems with thinking (cognitive changes). MS can also cause symptoms that are associated with the disease, but are not always the direct result of an MS attack. They may include:  Inability to control urination or bowel movements (incontinence).  Headaches.  Fatigue.  Inability to tolerate heat.  Emotional changes.  Depression.  Pain. How is this diagnosed? This condition is diagnosed based on:  Your symptoms.  A neurological exam. This involves checking central nervous system function, such as nerve function, reflexes, and coordination.  MRIs of the brain and spinal cord.  Lab tests, including a lumbar puncture that tests the fluid that surrounds the brain and spinal cord (cerebrospinal fluid).  Tests to measure the electrical activity of the brain in response to stimulation (evoked potentials). How is this treated? There is no cure for MS, but medicines can help decrease the number and frequency of attacks and help relieve nuisance symptoms. Treatment options may include:  Medicines that reduce the frequency of attacks. These medicines may be given by injection, by mouth (orally),  or through an IV.  Medicines that reduce inflammation (steroids). These may provide short-term relief of symptoms.  Medicines to help control pain, depression, fatigue, or incontinence.  Vitamin D, if you have a deficiency.  Using devices to help you move around (assistive devices), such as braces, a cane, or a walker.  Physical therapy to strengthen and stretch your muscles.  Occupational therapy to help you with everyday tasks.  Alternative or complementary treatments such as exercise, massage, or acupuncture. Follow these instructions at home:  Take over-the-counter and prescription medicines only as told by your health care provider.  Do not drive or use heavy machinery while taking prescription pain medicine.  Use assistive devices as recommended by your physical therapist or your health care provider.  Exercise as directed by your health care provider.  Return to your normal activities as told by your health care provider. Ask your health care provider what activities are safe for you.  Reach out for support. Share your feelings with friends, family, or a support group.  Keep all follow-up visits as told by your health care provider and therapists. This is important. Where to find more information  National Multiple Sclerosis Society: https://www.nationalmssociety.org Contact a health care provider if:  You feel depressed.  You develop new pain or numbness.  You have tremors.  You have problems with sexual function. Get help right away if:  You develop paralysis.  You develop numbness.  You have problems with your bladder or bowel function.  You develop double vision.  You lose vision in one or both eyes.  You develop suicidal thoughts.  You develop severe confusion. If you ever feel like you may hurt yourself or others, or have thoughts about taking your own life, get help right away. You can go to your nearest emergency department or call:  Your  local emergency services (911 in the [place]  A suicide crisis helpline, such as the [facility] at (301)249-3425. This is open 24 hours a day. Summary  Multiple sclerosis (MS) is a disease of the central nervous system that causes the body's immune system to destroy the protective covering (myelin sheath) around nerves in the brain.  There are 3 types of MS: relapsing-remitting, secondary progressive, and primary progressive. Relapsing-remitting and secondary progressive MS cause symptoms to occur in episodes or attacks that may last weeks to months. Primary progressive MS causes symptoms to steadily progress after they develop.  There is no cure for MS, but medicines can help decrease the number and frequency of attacks and help relieve nuisance symptoms. Treatment may also include physical or occupational therapy.  If you develop numbness, paralysis, vision problems, or other neurological symptoms, get help  right away. This information is not intended to replace advice given to you by your health care provider. Make sure you discuss any questions you have with your health care provider. Document Revised: 04/23/2017 Document Reviewed: 07/20/2016 Elsevier Patient Education  2020 [employer].
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
## 2019-08-07 ENCOUNTER — Ambulatory Visit: Payer: Medicare HMO | Admitting: Family Medicine

## 2019-08-08 ENCOUNTER — Telehealth: Payer: Self-pay | Admitting: Family Medicine

## 2019-08-08 DIAGNOSIS — R3915 Urgency of urination: Secondary | ICD-10-CM

## 2019-08-08 NOTE — Telephone Encounter (Signed)
I called pt and she started takeing 5mg  po qhs for 2 days and went up to taking 10mg   And this seemed to work better.  She is willing to go back to taking 5mg  po qhs for th 2 wks if you feel this may be take time to work.  Please let me know.

## 2019-08-08 NOTE — Telephone Encounter (Signed)
Pt has called to report that she would like Amy,NP to call in 10 mg of the oxybutynin (DITROPAN-XL) 5 MG 24 hr tablet since she has been doubling up due to 5mg  not helping.  Pt would like this called into [facility] mail delivery.

## 2019-08-08 NOTE — Telephone Encounter (Signed)
We just started 5mg  on 3/3. How long has she taken medication? It can take a few weeks to be effective. I do not mind increasing dose to 10 if needed but want to give her some time to see how it is going to work and if she develops any side effects.

## 2019-08-09 MED ORDER — OXYBUTYNIN CHLORIDE ER 10 MG PO TB24: 10.0000 mg | ORAL_TABLET | Freq: Every day | ORAL | 3 refills | Status: DC

## 2019-08-09 NOTE — Telephone Encounter (Signed)
Cancelled [facility] prescription.

## 2019-08-09 NOTE — Addendum Note (Signed)
Addended byNicholas Lose, Jericho Alcorn L on: 08/09/2019 08:00 AM   Modules accepted: Orders

## 2019-08-09 NOTE — Telephone Encounter (Signed)
Spoke to pt and per AL/NP ok to continue on 10gm po daily as tolerated.  Instructed can cause constipation to keep well hydrated.   Would like to go to [facility] mail order.  DONE.

## 2019-08-09 NOTE — Addendum Note (Signed)
Addended by: Hermenia Fiscal S on: 08/09/2019 12:06 PM   Modules accepted: Orders

## 2019-08-16 NOTE — Progress Notes (Signed)
I reviewed note and agree with plan.   Suanne Marker, MD 08/16/2019, 8:36 AM Certified in Neurology, Neurophysiology and Neuroimaging  [facility] [address], [address] [place], [place] [address] [phone]

## 2019-08-28 ENCOUNTER — Telehealth: Payer: Self-pay | Admitting: Family Medicine

## 2019-08-28 MED ORDER — GILENYA 0.5 MG PO CAPS: 0.5000 mg | ORAL_CAPSULE | Freq: Every day | ORAL | 3 refills | Status: DC

## 2019-08-28 NOTE — Telephone Encounter (Signed)
I called pt and new prescription escribed to [facility] for PT ASSISTANCE, I relayed to pt to f/u on tomorrow make sure they received.

## 2019-08-28 NOTE — Telephone Encounter (Signed)
Pt called stating that her pharmacy informed her that they are needing a new prescription for her Fingolimod HCl (GILENYA) 0.5 MG CAPS Please advise.

## 2019-08-30 ENCOUNTER — Telehealth: Payer: Self-pay | Admitting: Family Medicine

## 2019-08-30 NOTE — Telephone Encounter (Signed)
I called and spoke to [place], Teacher, early years/pre at [employer].  Theu will mark stat and get to pt.  They did have prescription just need supervising physician (VP/MD given).  Pt made aware.

## 2019-08-30 NOTE — Telephone Encounter (Signed)
Pt called wanting to speak to RN about her Gilenya prescription. Please advise.

## 2019-12-04 ENCOUNTER — Telehealth: Payer: Self-pay | Admitting: Family Medicine

## 2019-12-04 NOTE — Telephone Encounter (Signed)
I called pt and relayed that application from provider was done and faxed back to PAP.  I wanted to let her know.  She will f/u on this on her end and will call back as needed.

## 2019-12-04 NOTE — Telephone Encounter (Signed)
I called and spoke to [place], with [employer] P[facility] [phone], fax [phone].  Need pg 4 of PAP application.  Done, faxed and received confirmation. They have prescription, but the application for continued PAP has been in process since end of year.  This is first that I see, since 08/2019.

## 2019-12-04 NOTE — Telephone Encounter (Signed)
Pt has called to inform that the RN needs to call in the order for her Fingolimod HCl (GILENYA) 0.5 MG CAPS to  [facility] @ [phone]

## 2019-12-07 NOTE — Telephone Encounter (Signed)
Received approval for PAP for pt for the remainder of the calendar year.  ID [id number] GILENYA.  Pt was notified and she had also received notice.

## 2020-02-01 ENCOUNTER — Ambulatory Visit: Payer: Medicare HMO | Admitting: Family Medicine

## 2020-02-01 ENCOUNTER — Other Ambulatory Visit: Payer: Self-pay

## 2020-02-01 ENCOUNTER — Encounter: Payer: Self-pay | Admitting: Family Medicine

## 2020-02-01 VITALS — BP 149/79 | HR 54 | Ht 65.5 in | Wt 179.6 lb

## 2020-02-01 DIAGNOSIS — R269 Unspecified abnormalities of gait and mobility: Secondary | ICD-10-CM

## 2020-02-01 DIAGNOSIS — G35 Multiple sclerosis: Secondary | ICD-10-CM | POA: Diagnosis not present

## 2020-02-01 DIAGNOSIS — R3915 Urgency of urination: Secondary | ICD-10-CM

## 2020-02-01 NOTE — Patient Instructions (Addendum)
We will continue current treatment plan. I will update labs today. Stay well hydrated. Well balanced diet and regular physical activity encouraged.   Follow up in 6 months    Multiple Sclerosis Multiple sclerosis (MS) is a disease of the brain, spinal cord, and optic nerves (central nervous system). It causes the body's disease-fighting (immune) system to destroy the protective covering (myelin sheath) around nerves in the brain. When this happens, signals (nerve impulses) going to and from the brain and spinal cord do not get sent properly or may not get sent at all. There are several types of MS:  Relapsing-remitting MS. This is the most common type. This causes sudden attacks of symptoms. After an attack, you may recover completely until the next attack, or some symptoms may remain permanently.  Secondary progressive MS. This usually develops after the onset of relapsing-remitting MS. Similar to relapsing-remitting MS, this type also causes sudden attacks of symptoms. Attacks may be less frequent, but symptoms slowly get worse (progress) over time.  Primary progressive MS. This causes symptoms that steadily progress over time. This type of MS does not cause sudden attacks of symptoms. The age of onset of MS varies, but it often develops between 4-69 years of age. MS is a lifelong (chronic) condition. There is no cure, but treatment can help slow down the progression of the disease. What are the causes? The cause of this condition is not known. What increases the risk? You are more likely to develop this condition if:  You are a woman.  You have a relative with MS. However, the condition is not passed from parent to child (inherited).  You have a lack (deficiency) of vitamin D.  You smoke. MS is more common in the [place] than in the [place]. What are the signs or symptoms? Relapsing-remitting and secondary progressive MS cause symptoms to occur in  episodes or attacks that may last weeks to months. There may be long periods between attacks in which there are almost no symptoms. Primary progressive MS causes symptoms to steadily progress after they develop. Symptoms of MS vary because of the many different ways it affects the central nervous system. The main symptoms include:  Vision problems and eye pain.  Numbness.  Weakness.  Inability to move your arms, hands, feet, or legs (paralysis).  Balance problems.  Shaking that you cannot control (tremors).  Muscle spasms.  Problems with thinking (cognitive changes). MS can also cause symptoms that are associated with the disease, but are not always the direct result of an MS attack. They may include:  Inability to control urination or bowel movements (incontinence).  Headaches.  Fatigue.  Inability to tolerate heat.  Emotional changes.  Depression.  Pain. How is this diagnosed? This condition is diagnosed based on:  Your symptoms.  A neurological exam. This involves checking central nervous system function, such as nerve function, reflexes, and coordination.  MRIs of the brain and spinal cord.  Lab tests, including a lumbar puncture that tests the fluid that surrounds the brain and spinal cord (cerebrospinal fluid).  Tests to measure the electrical activity of the brain in response to stimulation (evoked potentials). How is this treated? There is no cure for MS, but medicines can help decrease the number and frequency of attacks and help relieve nuisance symptoms. Treatment options may include:  Medicines that reduce the frequency of attacks. These medicines may be given by injection, by mouth (orally), or through an IV.  Medicines that reduce  inflammation (steroids). These may provide short-term relief of symptoms.  Medicines to help control pain, depression, fatigue, or incontinence.  Vitamin D, if you have a deficiency.  Using devices to help you move  around (assistive devices), such as braces, a cane, or a walker.  Physical therapy to strengthen and stretch your muscles.  Occupational therapy to help you with everyday tasks.  Alternative or complementary treatments such as exercise, massage, or acupuncture. Follow these instructions at home:  Take over-the-counter and prescription medicines only as told by your health care provider.  Do not drive or use heavy machinery while taking prescription pain medicine.  Use assistive devices as recommended by your physical therapist or your health care provider.  Exercise as directed by your health care provider.  Return to your normal activities as told by your health care provider. Ask your health care provider what activities are safe for you.  Reach out for support. Share your feelings with friends, family, or a support group.  Keep all follow-up visits as told by your health care provider and therapists. This is important. Where to find more information  National Multiple Sclerosis Society: https://www.nationalmssociety.org Contact a health care provider if:  You feel depressed.  You develop new pain or numbness.  You have tremors.  You have problems with sexual function. Get help right away if:  You develop paralysis.  You develop numbness.  You have problems with your bladder or bowel function.  You develop double vision.  You lose vision in one or both eyes.  You develop suicidal thoughts.  You develop severe confusion. If you ever feel like you may hurt yourself or others, or have thoughts about taking your own life, get help right away. You can go to your nearest emergency department or call:  Your local emergency services (911 in the [place]  A suicide crisis helpline, such as the National Suicide Prevention Lifeline at (845)014-0767. This is open 24 hours a day. Summary  Multiple sclerosis (MS) is a disease of the central nervous system that causes the  body's immune system to destroy the protective covering (myelin sheath) around nerves in the brain.  There are 3 types of MS: relapsing-remitting, secondary progressive, and primary progressive. Relapsing-remitting and secondary progressive MS cause symptoms to occur in episodes or attacks that may last weeks to months. Primary progressive MS causes symptoms to steadily progress after they develop.  There is no cure for MS, but medicines can help decrease the number and frequency of attacks and help relieve nuisance symptoms. Treatment may also include physical or occupational therapy.  If you develop numbness, paralysis, vision problems, or other neurological symptoms, get help right away. This information is not intended to replace advice given to you by your health care provider. Make sure you discuss any questions you have with your health care provider. Document Revised: 04/23/2017 Document Reviewed: 07/20/2016 Elsevier Patient Education  2020 [employer].
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
## 2020-02-02 LAB — CBC WITH DIFFERENTIAL/PLATELET
Basophils Absolute: 0 10*3/uL (ref 0.0–0.2)
Basos: 0 %
EOS (ABSOLUTE): 0.1 10*3/uL (ref 0.0–0.4)
Eos: 3 %
Hematocrit: 40.3 % (ref 34.0–46.6)
Hemoglobin: 13 g/dL (ref 11.1–15.9)
Immature Grans (Abs): 0 10*3/uL (ref 0.0–0.1)
Immature Granulocytes: 1 %
Lymphocytes Absolute: 0.2 10*3/uL — ABNORMAL LOW (ref 0.7–3.1)
Lymphs: 4 %
MCH: 28.3 pg (ref 26.6–33.0)
MCHC: 32.3 g/dL (ref 31.5–35.7)
MCV: 88 fL (ref 79–97)
Monocytes Absolute: 0.5 10*3/uL (ref 0.1–0.9)
Monocytes: 12 %
Neutrophils Absolute: 3.4 10*3/uL (ref 1.4–7.0)
Neutrophils: 80 %
Platelets: 284 10*3/uL (ref 150–450)
RBC: 4.6 x10E6/uL (ref 3.77–5.28)
RDW: 13.1 % (ref 11.7–15.4)
WBC: 4.3 10*3/uL (ref 3.4–10.8)

## 2020-02-02 LAB — COMPREHENSIVE METABOLIC PANEL
ALT: 16 IU/L (ref 0–32)
AST: 19 IU/L (ref 0–40)
Albumin/Globulin Ratio: 2.2 (ref 1.2–2.2)
Albumin: 4.1 g/dL (ref 3.8–4.8)
Alkaline Phosphatase: 167 IU/L — ABNORMAL HIGH (ref 48–121)
BUN/Creatinine Ratio: 31 — ABNORMAL HIGH (ref 12–28)
BUN: 19 mg/dL (ref 8–27)
Bilirubin Total: 0.5 mg/dL (ref 0.0–1.2)
CO2: 27 mmol/L (ref 20–29)
Calcium: 9.2 mg/dL (ref 8.7–10.3)
Chloride: 105 mmol/L (ref 96–106)
Creatinine, Ser: 0.62 mg/dL (ref 0.57–1.00)
GFR calc Af Amer: 110 mL/min/{1.73_m2} (ref >59)
GFR calc non Af Amer: 96 mL/min/{1.73_m2} (ref >59)
Globulin, Total: 1.9 g/dL (ref 1.5–4.5)
Glucose: 85 mg/dL (ref 65–99)
Potassium: 4.4 mmol/L (ref 3.5–5.2)
Sodium: 143 mmol/L (ref 134–144)
Total Protein: 6 g/dL (ref 6.0–8.5)

## 2020-02-06 ENCOUNTER — Telehealth: Payer: Self-pay | Admitting: *Deleted

## 2020-02-06 NOTE — Telephone Encounter (Signed)
-----  Message from Debbora Presto, NP sent at 02/06/2020 11:57 AM EDT ----- Please let her know that her labs look stable. Her alkaline phosphatase is elevated. There are many reasons for this. Other liver enzymes look good. We will keep a close eye on alk phos levels. We will recheck at next visit.

## 2020-02-06 NOTE — Telephone Encounter (Signed)
Relayed to pt that her labs were stable. Her alkaline phosphatase is elevated. There are many reasons for this. Other liver enzymes look good. We will keep a close eye on alk phos levels. We will recheck at next visit.  Pt verbalized understanding.

## 2020-02-27 NOTE — Progress Notes (Signed)
I reviewed note and agree with plan.   Suanne Marker, MD 02/27/2020, 1:13 PM Certified in Neurology, Neurophysiology and Neuroimaging  [facility] [address] [place], [place] [address] [phone]

## 2020-02-27 NOTE — Progress Notes (Signed)
I reviewed note and agree with plan.   Priscillia Fouch R. Quianna Avery, MD 02/27/2020, 1:10 PM Certified in Neurology, Neurophysiology and Neuroimaging  [facility] [address], [address] [place], [place] [address] [phone]  

## 2020-04-16 ENCOUNTER — Telehealth: Payer: Self-pay | Admitting: *Deleted

## 2020-04-16 NOTE — Telephone Encounter (Signed)
Signed and placed in mail per pt request.

## 2020-04-16 NOTE — Telephone Encounter (Signed)
Received [employer] PAP form to be completed on MD end.  To be signed. (placed inbox)

## 2020-05-08 NOTE — Telephone Encounter (Signed)
I had placed in mail per pt request.  I faxed  MD part to [employer] [phone].

## 2020-07-24 NOTE — Telephone Encounter (Addendum)
Received letter from [employer] PAP, she has been approved for NPAF assistance for the remainder of calendar year. She'll receive medicine at no cost. Patient ID# [id number] For assistance call [phone]. Called patient , LVM advising her of letter of approval. Advised her per letter she needs to call her Medicare Part D and let them know she is receiving drug for [employer] PAP. Left # for questions.

## 2020-07-31 ENCOUNTER — Ambulatory Visit: Payer: Medicare HMO | Admitting: Family Medicine

## 2021-02-19 ENCOUNTER — Ambulatory Visit: Payer: Medicare HMO | Admitting: Family Medicine

## 2021-02-19 ENCOUNTER — Encounter: Payer: Self-pay | Admitting: Family Medicine

## 2021-02-19 VITALS — BP 180/90 | HR 60 | Ht 65.5 in | Wt 168.5 lb

## 2021-02-19 DIAGNOSIS — R3915 Urgency of urination: Secondary | ICD-10-CM

## 2021-02-19 DIAGNOSIS — G35 Multiple sclerosis: Secondary | ICD-10-CM | POA: Diagnosis not present

## 2021-02-19 MED ORDER — OXYBUTYNIN CHLORIDE ER 10 MG PO TB24: 10.0000 mg | ORAL_TABLET | Freq: Every day | ORAL | 3 refills | Status: DC
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
## 2021-02-19 NOTE — Patient Instructions (Signed)
Below is our plan:  We will continue Gilenya. I will refill oxybutynin. Please keep an eye on your blood pressure at home.   Please make sure you are staying well hydrated. I recommend 50-60 ounces daily. Well balanced diet and regular exercise encouraged. Consistent sleep schedule with 6-8 hours recommended.   Please continue follow up with care team as directed.   Follow up with Dr Marjory Lies in 6 months    You may receive a survey regarding today's visit. I encourage you to leave honest feed back as I do use this information to improve patient care. Thank you for seeing me today!

## 2021-03-08 ENCOUNTER — Other Ambulatory Visit: Payer: Medicare HMO

## 2021-03-11 ENCOUNTER — Telehealth: Payer: Self-pay | Admitting: Family Medicine

## 2021-03-11 NOTE — Telephone Encounter (Signed)
FYI-Pt called wanting to inform the provider that she was not able to get her MRI done due to not being able to afford it. She will check into it at a later time.

## 2021-03-11 NOTE — Telephone Encounter (Signed)
Amy- FYI  Called pt back. Advised there is a MRI access program through [employer] ([employer] [place] that can help provide financial assistance for MRI's. Provided her their phone# [employer] at [phone], ext. [phone]. She will call to inquire and let us know if she decides to apply.  She preferred to have phone# over website.

## 2021-03-27 ENCOUNTER — Other Ambulatory Visit: Payer: Self-pay | Admitting: *Deleted

## 2021-03-27 MED ORDER — FINGOLIMOD HCL 0.5 MG PO CAPS: 0.5000 mg | ORAL_CAPSULE | Freq: Every day | ORAL | 3 refills | Status: DC

## 2021-04-03 ENCOUNTER — Telehealth: Payer: Self-pay | Admitting: Family Medicine

## 2021-04-03 MED ORDER — FINGOLIMOD HCL 0.5 MG PO CAPS: 0.5000 mg | ORAL_CAPSULE | Freq: Every day | ORAL | 3 refills | Status: DC

## 2021-04-03 NOTE — Telephone Encounter (Signed)
Pt called, your office can go on line to fill out paperwork application enrollment for Fingolimod HCl (GILENYA) 0.5 MG CAPS I  would not have to pay the $50 to have filled out by your office. Would like a call from the nurse

## 2021-04-03 NOTE — Telephone Encounter (Signed)
Called the patient and she is referring to filling out the patient assistance MD part of the [employer] paperwork. Advise the patient there is not a fee for completing this paperwork. I was able to find the form online and print it and will complete my part and send in for the patient. Pt verbalized understanding.

## 2021-04-08 NOTE — Telephone Encounter (Signed)
Faxed completed/signed form to [employer] at [phone]. Received fax confirmation.

## 2021-04-10 ENCOUNTER — Other Ambulatory Visit: Payer: Self-pay

## 2021-04-10 MED ORDER — FINGOLIMOD HCL 0.5 MG PO CAPS: 0.5000 mg | ORAL_CAPSULE | Freq: Every day | ORAL | 3 refills | Status: DC

## 2021-04-15 DIAGNOSIS — H25812 Combined forms of age-related cataract, left eye: Secondary | ICD-10-CM | POA: Insufficient documentation

## 2021-07-15 ENCOUNTER — Telehealth: Payer: Self-pay | Admitting: Diagnostic Neuroimaging

## 2021-07-15 ENCOUNTER — Encounter: Payer: Self-pay | Admitting: Diagnostic Neuroimaging

## 2021-07-15 NOTE — Telephone Encounter (Signed)
I tried both the phone numbers and was not able to reach the patient-one phone was busy and the other did not have a voice mail. I had to move her appointment up a day due to Dr. Richrd Humbles schedule change. I sent a letter in the mail with this information.

## 2021-07-21 ENCOUNTER — Telehealth: Payer: Self-pay | Admitting: Family Medicine

## 2021-07-21 DIAGNOSIS — G35 Multiple sclerosis: Secondary | ICD-10-CM

## 2021-07-21 MED ORDER — FINGOLIMOD HCL 0.5 MG PO CAPS: 0.5000 mg | ORAL_CAPSULE | Freq: Every day | ORAL | 1 refills | Status: DC

## 2021-07-21 NOTE — Telephone Encounter (Signed)
Pt request refill for Fingolimod HCl (GILENYA) 0.5 MG CAPS at [facility] [employer] [address]

## 2021-07-21 NOTE — Telephone Encounter (Signed)
E-scribed refill. Pt has appt w/ Dr. Marjory Lies 08/19/21.

## 2021-07-28 ENCOUNTER — Telehealth: Payer: Self-pay | Admitting: Family Medicine

## 2021-07-28 NOTE — Telephone Encounter (Signed)
Called [employer] [facility] at [phone]. Spoke w/ Baird Lyons. Time for pt to re-enroll. Advised we sent completed prescriber application on 03/31/21 and 04/08/21. Confirmed they have both. Will send over to intake team to move forward with things. Unsure why they are not being processed. Nothing further needed at this time. ? ?Pt should call them tomorrow to confirm things are being processed.  ? ?I called pt and provided above info. Pt verbalized understanding and appreciation. ?

## 2021-07-28 NOTE — Telephone Encounter (Signed)
Pt said pharmacy has the prescription Fingolimod HCl (GILENYA) 0.5 MG CAPS but pharmacy did not get application from physician. Would like a call from the nurse. ?

## 2021-08-19 ENCOUNTER — Ambulatory Visit: Payer: Medicare HMO | Admitting: Diagnostic Neuroimaging

## 2021-08-20 ENCOUNTER — Ambulatory Visit: Payer: Medicare HMO | Admitting: Diagnostic Neuroimaging

## 2021-08-25 ENCOUNTER — Encounter (INDEPENDENT_AMBULATORY_CARE_PROVIDER_SITE_OTHER): Payer: Medicare HMO | Admitting: Ophthalmology

## 2021-11-10 ENCOUNTER — Ambulatory Visit: Payer: Medicare HMO | Admitting: Diagnostic Neuroimaging

## 2021-11-10 ENCOUNTER — Encounter: Payer: Self-pay | Admitting: Diagnostic Neuroimaging

## 2021-11-10 VITALS — BP 170/88 | HR 55 | Ht 65.0 in

## 2021-11-10 DIAGNOSIS — G35 Multiple sclerosis: Secondary | ICD-10-CM

## 2021-11-10 DIAGNOSIS — N3944 Nocturnal enuresis: Secondary | ICD-10-CM | POA: Diagnosis not present
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
## 2021-11-10 NOTE — Patient Instructions (Signed)
MULTIPLE SCLEROSIS - check MRI brain - stop gilenya; wait 4 weeks; repeat CBC; then may start tecfidera or vumerity  BLADDER INCONTINENCE (nocturnal) - tried and failed oxybutynin; refer to urology

## 2021-11-12 ENCOUNTER — Telehealth: Payer: Self-pay | Admitting: Diagnostic Neuroimaging

## 2021-11-12 NOTE — Telephone Encounter (Signed)
Referral for Urology sent to [facility] [phone]. 

## 2021-11-17 ENCOUNTER — Telehealth: Payer: Self-pay | Admitting: Diagnostic Neuroimaging

## 2021-11-17 NOTE — Telephone Encounter (Signed)
[employer] medicare Auth: [id number] exp. 11/17/21-12/17/21 sent to GI

## 2021-11-27 ENCOUNTER — Other Ambulatory Visit (INDEPENDENT_AMBULATORY_CARE_PROVIDER_SITE_OTHER): Payer: Self-pay

## 2021-11-27 DIAGNOSIS — Z0289 Encounter for other administrative examinations: Secondary | ICD-10-CM

## 2021-12-01 ENCOUNTER — Ambulatory Visit
Admission: RE | Admit: 2021-12-01 | Discharge: 2021-12-01 | Disposition: A | Discharge status: Home / Self Care | Payer: Medicare HMO | Source: Ambulatory Visit | Attending: Diagnostic Neuroimaging | Admitting: Diagnostic Neuroimaging

## 2021-12-01 DIAGNOSIS — G35 Multiple sclerosis: Secondary | ICD-10-CM | POA: Diagnosis not present

## 2021-12-01 MED ORDER — GADOBENATE DIMEGLUMINE 529 MG/ML IV SOLN
15.0000 mL | Freq: Once | INTRAVENOUS | Status: AC | PRN
  Administered 2021-12-01: 15 mL via INTRAVENOUS

## 2021-12-02 ENCOUNTER — Telehealth: Payer: Self-pay | Admitting: Neurology

## 2021-12-02 NOTE — Telephone Encounter (Signed)
-----   Message from Shawnie Dapper, NP sent at 12/02/2021  9:07 AM EDT ----- Please let her know that her MRI was stable. No new lesions or significant progression since 2016.

## 2021-12-02 NOTE — Telephone Encounter (Signed)
Called the pt and made aware of the results. She was appreciative for the information and had no further questions.

## 2021-12-23 ENCOUNTER — Other Ambulatory Visit: Payer: Self-pay | Admitting: *Deleted

## 2021-12-23 ENCOUNTER — Other Ambulatory Visit: Payer: Medicare HMO

## 2021-12-23 DIAGNOSIS — G35 Multiple sclerosis: Secondary | ICD-10-CM

## 2021-12-23 DIAGNOSIS — Z0289 Encounter for other administrative examinations: Secondary | ICD-10-CM

## 2021-12-23 DIAGNOSIS — Z5181 Encounter for therapeutic drug level monitoring: Secondary | ICD-10-CM

## 2021-12-24 ENCOUNTER — Telehealth: Payer: Self-pay

## 2021-12-24 DIAGNOSIS — G35 Multiple sclerosis: Secondary | ICD-10-CM

## 2021-12-24 LAB — CBC WITH DIFFERENTIAL/PLATELET
Basophils Absolute: 0 10*3/uL (ref 0.0–0.2)
Basos: 1 %
EOS (ABSOLUTE): 0.1 10*3/uL (ref 0.0–0.4)
Eos: 2 %
Hematocrit: 40.1 % (ref 34.0–46.6)
Hemoglobin: 13.1 g/dL (ref 11.1–15.9)
Immature Grans (Abs): 0.1 10*3/uL (ref 0.0–0.1)
Immature Granulocytes: 1 %
Lymphocytes Absolute: 0.3 10*3/uL — ABNORMAL LOW (ref 0.7–3.1)
Lymphs: 7 %
MCH: 28.1 pg (ref 26.6–33.0)
MCHC: 32.7 g/dL (ref 31.5–35.7)
MCV: 86 fL (ref 79–97)
Monocytes Absolute: 0.5 10*3/uL (ref 0.1–0.9)
Monocytes: 11 %
Neutrophils Absolute: 3.5 10*3/uL (ref 1.4–7.0)
Neutrophils: 78 %
Platelets: 260 10*3/uL (ref 150–450)
RBC: 4.67 x10E6/uL (ref 3.77–5.28)
RDW: 13.7 % (ref 11.7–15.4)
WBC: 4.5 10*3/uL (ref 3.4–10.8)

## 2021-12-24 MED ORDER — FINGOLIMOD HCL 0.5 MG PO CAPS: 0.5000 mg | ORAL_CAPSULE | Freq: Every day | ORAL | 1 refills | Status: DC

## 2021-12-24 NOTE — Telephone Encounter (Signed)
-----   Message from Suanne Marker, MD sent at 12/24/2021  3:32 PM EDT ----- Stable CBC. Continue gilenya. -VRP

## 2021-12-24 NOTE — Telephone Encounter (Signed)
Contacted pt, informed her of labs. She stated MD took her off Gilenya, I advised her he wants her to continue it now. Will send Rx to [facility] pharmacy as requested. Pt was unsure of pharmacy, informed her that's where last rx was sent to, she said ok that's fine.

## 2023-02-05 DIAGNOSIS — M79671 Pain in right foot: Secondary | ICD-10-CM | POA: Insufficient documentation

## 2023-02-08 ENCOUNTER — Encounter: Payer: Self-pay | Admitting: Family Medicine

## 2023-02-08 ENCOUNTER — Other Ambulatory Visit: Payer: Self-pay | Admitting: Family Medicine

## 2023-02-08 DIAGNOSIS — M79671 Pain in right foot: Secondary | ICD-10-CM

## 2023-02-24 ENCOUNTER — Ambulatory Visit (HOSPITAL_BASED_OUTPATIENT_CLINIC_OR_DEPARTMENT_OTHER): Admit: 2023-02-24 | Payer: Medicare HMO | Admitting: Orthopaedic Surgery

## 2023-02-24 ENCOUNTER — Encounter (HOSPITAL_BASED_OUTPATIENT_CLINIC_OR_DEPARTMENT_OTHER): Payer: Self-pay

## 2023-02-24 SURGERY — OPEN REDUCTION INTERNAL FIXATION (ORIF) ANKLE FRACTURE
Anesthesia: Choice | Site: Ankle | Laterality: Right

## 2024-02-17 ENCOUNTER — Ambulatory Visit: Admitting: Nurse Practitioner

## 2024-03-31 ENCOUNTER — Encounter: Payer: Self-pay | Admitting: *Deleted

## 2024-05-22 ENCOUNTER — Ambulatory Visit: Payer: Self-pay | Admitting: Nurse Practitioner

## 2024-05-29 ENCOUNTER — Encounter: Payer: Self-pay | Admitting: Nurse Practitioner

## 2024-05-29 ENCOUNTER — Ambulatory Visit (INDEPENDENT_AMBULATORY_CARE_PROVIDER_SITE_OTHER): Admitting: Nurse Practitioner

## 2024-05-29 VITALS — BP 128/75 | HR 55 | Temp 97.4°F

## 2024-05-29 DIAGNOSIS — Z1211 Encounter for screening for malignant neoplasm of colon: Secondary | ICD-10-CM | POA: Diagnosis not present

## 2024-05-29 DIAGNOSIS — Z23 Encounter for immunization: Secondary | ICD-10-CM

## 2024-05-29 DIAGNOSIS — Z0001 Encounter for general adult medical examination with abnormal findings: Secondary | ICD-10-CM | POA: Diagnosis not present

## 2024-05-29 DIAGNOSIS — G35D Multiple sclerosis, unspecified: Secondary | ICD-10-CM | POA: Diagnosis not present

## 2024-05-29 DIAGNOSIS — Z1231 Encounter for screening mammogram for malignant neoplasm of breast: Secondary | ICD-10-CM | POA: Diagnosis not present

## 2024-05-29 DIAGNOSIS — Z780 Asymptomatic menopausal state: Secondary | ICD-10-CM

## 2024-05-29 DIAGNOSIS — N3001 Acute cystitis with hematuria: Secondary | ICD-10-CM

## 2024-05-29 DIAGNOSIS — R351 Nocturia: Secondary | ICD-10-CM | POA: Diagnosis not present

## 2024-05-29 DIAGNOSIS — R300 Dysuria: Secondary | ICD-10-CM | POA: Diagnosis not present

## 2024-05-29 LAB — URINALYSIS, ROUTINE W REFLEX MICROSCOPIC
Bilirubin, UA: NEGATIVE
Glucose, UA: NEGATIVE
Ketones, UA: NEGATIVE
Nitrite, UA: POSITIVE — AB
Protein,UA: NEGATIVE
Specific Gravity, UA: 1.02 (ref 1.005–1.030)
Urobilinogen, Ur: 1 mg/dL (ref 0.2–1.0)
pH, UA: 5.5 (ref 5.0–7.5)

## 2024-05-29 LAB — MICROSCOPIC EXAMINATION
Renal Epithel, UA: NONE SEEN /HPF
Yeast, UA: NONE SEEN

## 2024-05-29 MED ORDER — SULFAMETHOXAZOLE-TRIMETHOPRIM 800-160 MG PO TABS: 1.0000 | ORAL_TABLET | Freq: Two times a day (BID) | ORAL | 0 refills | Status: AC

## 2024-05-29 NOTE — Progress Notes (Signed)
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 vitamin D  per day. If you are older than age 62 but younger than age 37, get at least 1,200 mg of calcium and at least 600 international units (IU) of vitamin D  per day. If you are older than age 30, get at least 1,200 mg of calcium and at least 800 international units (IU) of vitamin D  per day. Smoking and drinking excessive alcohol increase the risk of osteoporosis. Eat foods that are rich in calcium and vitamin D , and do weight-bearing exercises several times each week as directed by your health care provider. How does menopause affect my mental health? Depression may occur at any age, but it is more common as you become older. Common symptoms of depression include: Feeling depressed. Changes in sleep patterns. Changes in appetite or eating patterns. Feeling an overall lack of motivation or enjoyment of activities that you previously enjoyed. Frequent crying spells. Talk with your health care provider if  you think that you are experiencing any of these symptoms. General instructions See your health care provider for regular wellness exams and vaccines. This may include: Scheduling regular health, dental, and eye exams. Getting and maintaining your vaccines. These include: Influenza vaccine. Get this vaccine each year before the flu season begins. Pneumonia vaccine. Shingles vaccine. Tetanus, diphtheria, and pertussis (Tdap) booster vaccine. Your health care provider may also recommend other immunizations. Tell your health care provider if you have ever been abused or do not feel safe at home. Summary Menopause is a normal process in which your ability to get pregnant comes to an end. This condition causes hot flashes, night sweats, decreased interest in sex, mood swings, headaches, or lack of sleep. Treatment for this condition may include hormone replacement therapy. Take actions to keep yourself healthy, including exercising regularly, eating a healthy diet, watching your weight, and checking your blood pressure and blood sugar levels. Get screened for cancer and depression. Make sure that you are up to date with all your vaccines. This information is not intended to replace advice given to you by your health care provider. Make sure you discuss any questions you have with your health care provider. Document Revised: 09/30/2020 Document Reviewed: 09/30/2020 Elsevier Patient Education  2024 [employer] Cancer Screening: Female A cancer screening is a test or exam that checks for cancer. Work with your health care provider to create a cancer screening schedule that protects your health. Who should have screening? All females should be considered for screening of certain cancers, including breast cancer, cervical cancer, colorectal cancer, endometrial cancer, lung cancer, and skin cancer. Your health care provider may recommend screenings for other types of cancer if: You have had cancer  before. You have a family member with cancer. You have genes that could increase the risk of cancer. You have risk factors for certain cancers, such as current or past use of tobacco products or being overweight. What are the benefits of screening? Cancer screening is done to look for cancer in the very early stages, before it spreads and becomes harder to treat and before you would start to notice symptoms. Finding cancer early improves the chances of  successful treatment. It may save your life. When should I be screened for cancer? When you should be screened for cancer depends on: Your age. Your medical history and your family's medical history. Certain lifestyle factors, such as smoking or other use of tobacco products. Environmental exposure, such as to asbestos. How is screening done? Breast cancer Breast cancer screening is done with a test that takes images of breast tissue (mammogram) using an X-ray machine. Here are some screening guidelines for females at average risk: When you are 34-10 years old, you should be given the choice to start having mammograms. When you are 81-55 years old, you should have a mammogram every year. You may start having mammograms before you are 70 years old if you have risk factors for breast cancer, such as having an immediate family member with breast cancer. At 93 years old or older, you should have a mammogram every 1-2 years for as long as you are in good health and have a life expectancy of 10 years or longer. It is important to know what your breasts look and feel like so you can report any changes to your health care provider.   Cervical cancer Cervical cancer screening is done with an HPV (human papillomavirus) test to identify the virus that causes cervical cancer. To perform the test, a health care provider takes a swab of cells from the lowest part of the uterus (cervix) during a pelvic exam. This test may be performed along with a Pap test.  This testchecks for abnormalities in the cervix. All females at average risk should consider being screened for cervical cancer starting no later than 70 years old and continuing until 70 years old. Screening should not begin earlier than 70 years old. You will have tests every 3-5 years, depending on your results and the type of screening test. Talk with your health care provider about which screening test is right for you and how often you should be screened. If you have had the HPV vaccine, you will still be screened for cervical cancer and follow normal screening recommendations. You do not need to be screened for cervical cancer if any of the following apply to you: You are older than 70 years old and you have had normal screening tests in the past 10 years with no serious cervical precancer or cancer in the last 25 years. Your cervix and uterus have been removed, and you have never had cervical cancer or abnormal cells that could become cancer (precancerous cells). Colorectal cancer Colorectal cancer screening looks for cancer or for growths called polyps that often form before cancer starts. Tests to look for cancer or polyps include: Colonoscopy or flexible sigmoidoscopy. For these procedures, a flexible tube with a small camera is inserted into the rectum. CT colonography. This test uses X-rays and contrast dye to check the colon for polyps. Tests to look for cancer in the stool (feces) include: Guaiac-based fecal occult blood test (FOBT). This test can find blood in stool. It can be done at home with a kit. Fecal immunochemical test (FIT). This test can find blood in stool. For this test, you will need to collect stool samples at home. Stool DNA test. This test looks for blood in stool and any changes in DNA that can lead to colon cancer. For this test, you will need to collect a stool sample at home and send it to a lab. All adults should have screenings starting at 71 years old and  continuing through  70 years old. For females 68-81 years old, the decision to be screened should be based on a person's preferences, life expectancy, overall health, and prior screening history. Your health care provider may recommend screening before 70 years old. You will have tests every 1-10 years, depending on your results and the type of screening test. People at increased risk should start screening at an earlier age. Talk with your health care provider about which screening test is right for you and how often you should be screened. Endometrial cancer There is no standard screening test for endometrial cancer, and females at average risk do not routinely need to have this screening. Talk with your health care provider about whether screening is right for you. If it is, this screening is performed through: Endometrial tissue biopsy. This tests a sample of tissue taken from the lining of the uterus. Vaginal ultrasound. If you are at increased risk for endometrial cancer, you may need to have these tests more often than normal. You are at increased risk if: You have a family history of ovarian, uterine, or certain types of colon cancer. You are taking tamoxifen, a medicine used to treat breast cancer. If you have reached menopause, it is especially important to talk with your health care provider about any vaginal bleeding or spotting. Screening for endometrial cancer is not recommended for females who do not have symptoms of the cancer, such as vaginal bleeding. Lung cancer Lung cancer screening is done with a CT scan that looks for abnormal changes in the lungs. Discuss lung cancer screening with your health care provider if you are 82-61 years old and if any of the following apply to you: You currently smoke. You used to smoke heavily. You have a smoking history of 1 pack of cigarettes a day for 20 years or 2 packs a day for 10 years. You may need to be screened every year if you smoke heavily  or if you used to smoke. Skin cancer Skin cancer screening is done by checking the skin for unusual moles or spots and any changes in existing moles. Your health care provider should check your skin for signs of skin cancer at every physical exam. You should check your skin every month and tell your health care provider right away if anything looks unusual. Females with a higher-than-normal risk for skin cancer may want to see a skin specialist (dermatologist) for an annual body check. Where to find more information American Cancer Society: cancer.org Centers for Disease Control and Prevention: tonerpromos.no National Cancer Institute: cancer.gov U.S. Department of Health and Human Services: [place].ca Contact a health care provider if: You have concerns about any signs or symptoms of cancer. These may include: Skin problems. You may have: Moles of an unusual shape or color. Changes in existing moles. A sore on your skin that does not heal. Tiredness (fatigue) that does not go away. Losing weight without trying. Blood in your urine or stool. Problems with coughing or breathing. These may include: Coughing or trouble breathing that does not go away. Coughing up blood. Lumps or other changes in your breasts. Vaginal bleeding, spotting, or changes in your period. Frequent pain or cramping in your abdomen. This information is not intended to replace advice given to you by your health care provider. Make sure you discuss any questions you have with your health care provider. Document Revised: 05/19/2022 Document Reviewed: 12/01/2021 Elsevier Patient Education  2024 [employer] Colonoscopy, Adult A colonoscopy is a procedure to look at  the entire large intestine. This procedure is done using a long, thin, flexible tube that has a camera on the end. You may have a colonoscopy: As a part of normal colorectal screening. If you have certain symptoms, such as: A low number of red blood cells  in your blood (anemia). Diarrhea that does not go away. Pain in your abdomen. Blood in your stool. A colonoscopy can help screen for and diagnose medical problems, including: An abnormal growth of cells or tissue (tumor). Abnormal growths within the lining of your intestine (polyps). Inflammation. Areas of bleeding. Tell your health care provider about: Any allergies you have. All medicines you are taking, including vitamins, herbs, eye drops, creams, and over-the-counter medicines. Any problems you or family members have had with anesthetic medicines. Any bleeding problems you have. Any surgeries you have had. Any medical conditions you have. Any problems you have had with having bowel movements. Whether you are pregnant or may be pregnant. What are the risks? Generally, this is a safe procedure. However, problems may occur, including: Bleeding. Damage to your intestine. Allergic reactions to medicines given during the procedure. Infection. This is rare. What happens before the procedure? Eating and drinking restrictions Follow instructions from your health care provider about eating or drinking restrictions, which may include: A few days before the procedure: Follow a low-fiber diet. Avoid nuts, seeds, dried fruit, raw fruits, and vegetables. 1-3 days before the procedure: Eat only gelatin dessert or ice pops. Drink only clear liquids, such as water, clear juice, clear broth or bouillon, black coffee or tea, or clear soft drinks or sports drinks. Avoid liquids that contain red or purple dye. The day of the procedure: Do not eat solid foods. You may continue to drink clear liquids until up to 2 hours before the procedure. Do not eat or drink anything starting 2 hours before the procedure, or within the time period that your health care provider recommends. Bowel prep If you were prescribed a bowel prep to take by mouth (orally) to clean out your colon: Take it as told by your  health care provider. Starting the day before your procedure, you will need to drink a large amount of liquid medicine. The liquid will cause you to have many bowel movements of loose stool until your stool becomes almost clear or light green. If your skin or the opening between the buttocks (anus) gets irritated from diarrhea, you may relieve the irritation using: Wipes with medicine in them, such as adult wet wipes with aloe and vitamin E. A product to soothe skin, such as petroleum jelly. If you vomit while drinking the bowel prep: Take a break for up to 60 minutes. Begin the bowel prep again. Call your health care provider if you keep vomiting or you cannot take the bowel prep without vomiting. To clean out your colon, you may also be given: Laxative medicines. These help you have a bowel movement. Instructions for enema use. An enema is liquid medicine injected into your rectum. Medicines Ask your health care provider about: Changing or stopping your regular medicines or supplements. This is especially important if you are taking iron supplements, diabetes medicines, or blood thinners. Taking medicines such as aspirin  and ibuprofen. These medicines can thin your blood. Do not take these medicines unless your health care provider tells you to take them. Taking over-the-counter medicines, vitamins, herbs, and supplements. General instructions Ask your health care provider what steps will be taken to help prevent infection. These may include washing  skin with a germ-killing soap. If you will be going home right after the procedure, plan to have a responsible adult: Take you home from the hospital or clinic. You will not be allowed to drive. Care for you for the time you are told. What happens during the procedure?  An IV will be inserted into one of your veins. You will be given a medicine to make you fall asleep (general anesthetic). You will lie on your side with your knees bent. A  lubricant will be put on the tube. Then the tube will be: Inserted into your anus. Gently eased through all parts of your large intestine. Air will be sent into your colon to keep it open. This may cause some pressure or cramping. Images will be taken with the camera and will appear on a screen. A small tissue sample may be removed to be looked at under a microscope (biopsy). The tissue may be sent to a lab for testing if any signs of problems are found. If small polyps are found, they may be removed and checked for cancer cells. When the procedure is finished, the tube will be removed. The procedure may vary among health care providers and hospitals. What happens after the procedure? Your blood pressure, heart rate, breathing rate, and blood oxygen level will be monitored until you leave the hospital or clinic. You may have a small amount of blood in your stool. You may pass gas and have mild cramping or bloating in your abdomen. This is caused by the air that was used to open your colon during the exam. If you were given a sedative during the procedure, it can affect you for several hours. Do not drive or operate machinery until your health care provider says that it is safe. It is up to you to get the results of your procedure. Ask your health care provider, or the department that is doing the procedure, when your results will be ready. Summary A colonoscopy is a procedure to look at the entire large intestine. Follow instructions from your health care provider about eating and drinking before the procedure. If you were prescribed an oral bowel prep to clean out your colon, take it as told by your health care provider. During the colonoscopy, a flexible tube with a camera on its end is inserted into the anus and then passed into all parts of the large intestine. This information is not intended to replace advice given to you by your health care provider. Make sure you discuss any questions you  have with your health care provider. Document Revised: 06/23/2022 Document Reviewed: 01/01/2021 Elsevier Patient Education  2024 [employer]  The above assessment and management plan was discussed with the patient. The patient verbalized understanding of and has agreed to the management plan. Patient is aware to call the clinic if they develop any new symptoms or if symptoms persist or worsen. Patient is aware when to return to the clinic for a follow-up visit. Patient educated on when it is appropriate to go to the emergency department.    Nena Cassis Morton Hummer, DNP [facility] [address] [place], [place] [address] [phone]       [1]  Allergies Allergen Reactions   Nitrofurantoin Other (See Comments)    Unknown, Macrobid- was years ago   "

## 2024-05-30 ENCOUNTER — Ambulatory Visit: Payer: Self-pay | Admitting: Nurse Practitioner

## 2024-05-30 LAB — CMP14+EGFR
ALT: 15 IU/L (ref 0–32)
AST: 16 IU/L (ref 0–40)
Albumin: 3.8 g/dL — ABNORMAL LOW (ref 3.9–4.9)
Alkaline Phosphatase: 139 IU/L — ABNORMAL HIGH (ref 49–135)
BUN/Creatinine Ratio: 25 (ref 12–28)
BUN: 15 mg/dL (ref 8–27)
Bilirubin Total: 0.4 mg/dL (ref 0.0–1.2)
CO2: 25 mmol/L (ref 20–29)
Calcium: 9.5 mg/dL (ref 8.7–10.3)
Chloride: 104 mmol/L (ref 96–106)
Creatinine, Ser: 0.61 mg/dL (ref 0.57–1.00)
Globulin, Total: 2.6 g/dL (ref 1.5–4.5)
Glucose: 87 mg/dL (ref 70–99)
Potassium: 4.6 mmol/L (ref 3.5–5.2)
Sodium: 143 mmol/L (ref 134–144)
Total Protein: 6.4 g/dL (ref 6.0–8.5)
eGFR: 97 mL/min/1.73

## 2024-05-30 LAB — CBC WITH DIFFERENTIAL/PLATELET
Basophils Absolute: 0.1 x10E3/uL (ref 0.0–0.2)
Basos: 1 %
EOS (ABSOLUTE): 0.4 x10E3/uL (ref 0.0–0.4)
Eos: 5 %
Hematocrit: 39.8 % (ref 34.0–46.6)
Hemoglobin: 12.9 g/dL (ref 11.1–15.9)
Immature Grans (Abs): 0 x10E3/uL (ref 0.0–0.1)
Immature Granulocytes: 0 %
Lymphocytes Absolute: 1.1 x10E3/uL (ref 0.7–3.1)
Lymphs: 17 %
MCH: 27.4 pg (ref 26.6–33.0)
MCHC: 32.4 g/dL (ref 31.5–35.7)
MCV: 85 fL (ref 79–97)
Monocytes Absolute: 0.6 x10E3/uL (ref 0.1–0.9)
Monocytes: 9 %
Neutrophils Absolute: 4.5 x10E3/uL (ref 1.4–7.0)
Neutrophils: 68 %
Platelets: 307 x10E3/uL (ref 150–450)
RBC: 4.7 x10E6/uL (ref 3.77–5.28)
RDW: 12.6 % (ref 11.7–15.4)
WBC: 6.7 x10E3/uL (ref 3.4–10.8)

## 2024-05-30 LAB — THYROID PANEL WITH TSH
Free Thyroxine Index: 2.2 (ref 1.2–4.9)
T3 Uptake Ratio: 27 % (ref 24–39)
T4, Total: 8.2 ug/dL (ref 4.5–12.0)
TSH: 1.79 u[IU]/mL (ref 0.450–4.500)

## 2024-05-30 LAB — LIPID PANEL
Chol/HDL Ratio: 3 ratio (ref 0.0–4.4)
Cholesterol, Total: 184 mg/dL (ref 100–199)
HDL: 61 mg/dL
LDL Chol Calc (NIH): 107 mg/dL — ABNORMAL HIGH (ref 0–99)
Triglycerides: 87 mg/dL (ref 0–149)
VLDL Cholesterol Cal: 16 mg/dL (ref 5–40)

## 2024-05-30 LAB — HEPATITIS C ANTIBODY: Hep C Virus Ab: NONREACTIVE

## 2024-06-01 LAB — URINE CULTURE

## 2024-06-12 ENCOUNTER — Encounter: Payer: Self-pay | Admitting: *Deleted

## 2024-07-03 ENCOUNTER — Encounter

## 2024-11-27 ENCOUNTER — Ambulatory Visit: Admitting: Nurse Practitioner

## 2024-11-29 ENCOUNTER — Ambulatory Visit: Admitting: Family Medicine
